# Patient Record
Sex: Male | Born: 1945
Health system: Southern US, Community
[De-identification: ages and names within clinical notes are randomized; demographics above are authoritative.]

## PROBLEM LIST (undated history)

## (undated) DIAGNOSIS — I10 Essential (primary) hypertension: Secondary | ICD-10-CM

## (undated) DIAGNOSIS — I739 Peripheral vascular disease, unspecified: Secondary | ICD-10-CM

## (undated) DIAGNOSIS — G4733 Obstructive sleep apnea (adult) (pediatric): Secondary | ICD-10-CM

## (undated) DIAGNOSIS — Z9989 Dependence on other enabling machines and devices: Secondary | ICD-10-CM

## (undated) DIAGNOSIS — T884XXA Failed or difficult intubation, initial encounter: Secondary | ICD-10-CM

## (undated) DIAGNOSIS — M109 Gout, unspecified: Secondary | ICD-10-CM

## (undated) DIAGNOSIS — E785 Hyperlipidemia, unspecified: Secondary | ICD-10-CM

## (undated) DIAGNOSIS — I2510 Atherosclerotic heart disease of native coronary artery without angina pectoris: Secondary | ICD-10-CM

## (undated) HISTORY — PX: CHOLECYSTECTOMY: SHX55

## (undated) HISTORY — DX: Peripheral vascular disease, unspecified: I73.9

## (undated) HISTORY — DX: Essential (primary) hypertension: I10

## (undated) HISTORY — PX: KNEE ARTHROSCOPY W/ MENISCAL REPAIR: SHX1877

## (undated) HISTORY — PX: OTHER SURGICAL HISTORY: SHX169

## (undated) HISTORY — PX: CATARACT EXTRACTION: SUR2

## (undated) HISTORY — DX: Hyperlipidemia, unspecified: E78.5

## (undated) HISTORY — PX: TONSILLECTOMY: SUR1361

## (undated) HISTORY — DX: Atherosclerotic heart disease of native coronary artery without angina pectoris: I25.10

---

## 2000-05-16 HISTORY — PX: CARDIAC CATHETERIZATION: SHX172

## 2011-09-05 DIAGNOSIS — L259 Unspecified contact dermatitis, unspecified cause: Secondary | ICD-10-CM | POA: Diagnosis not present

## 2011-09-05 DIAGNOSIS — L981 Factitial dermatitis: Secondary | ICD-10-CM | POA: Diagnosis not present

## 2011-09-18 DIAGNOSIS — G4733 Obstructive sleep apnea (adult) (pediatric): Secondary | ICD-10-CM

## 2011-09-18 HISTORY — DX: Obstructive sleep apnea (adult) (pediatric): G47.33

## 2011-09-19 DIAGNOSIS — E785 Hyperlipidemia, unspecified: Secondary | ICD-10-CM | POA: Diagnosis not present

## 2011-09-19 DIAGNOSIS — E7800 Pure hypercholesterolemia, unspecified: Secondary | ICD-10-CM | POA: Diagnosis not present

## 2011-09-19 DIAGNOSIS — I2510 Atherosclerotic heart disease of native coronary artery without angina pectoris: Secondary | ICD-10-CM | POA: Diagnosis not present

## 2011-09-19 DIAGNOSIS — I10 Essential (primary) hypertension: Secondary | ICD-10-CM | POA: Diagnosis not present

## 2011-09-19 DIAGNOSIS — G4733 Obstructive sleep apnea (adult) (pediatric): Secondary | ICD-10-CM | POA: Diagnosis not present

## 2011-09-19 DIAGNOSIS — R0602 Shortness of breath: Secondary | ICD-10-CM | POA: Diagnosis not present

## 2011-09-19 DIAGNOSIS — Z125 Encounter for screening for malignant neoplasm of prostate: Secondary | ICD-10-CM | POA: Diagnosis not present

## 2011-09-19 DIAGNOSIS — R7309 Other abnormal glucose: Secondary | ICD-10-CM | POA: Diagnosis not present

## 2011-10-11 DIAGNOSIS — L98499 Non-pressure chronic ulcer of skin of other sites with unspecified severity: Secondary | ICD-10-CM | POA: Diagnosis not present

## 2012-01-31 DIAGNOSIS — I10 Essential (primary) hypertension: Secondary | ICD-10-CM | POA: Diagnosis not present

## 2012-01-31 DIAGNOSIS — E299 Testicular dysfunction, unspecified: Secondary | ICD-10-CM | POA: Diagnosis not present

## 2012-01-31 DIAGNOSIS — E785 Hyperlipidemia, unspecified: Secondary | ICD-10-CM | POA: Diagnosis not present

## 2012-01-31 DIAGNOSIS — Z125 Encounter for screening for malignant neoplasm of prostate: Secondary | ICD-10-CM | POA: Diagnosis not present

## 2012-02-02 DIAGNOSIS — Z1212 Encounter for screening for malignant neoplasm of rectum: Secondary | ICD-10-CM | POA: Diagnosis not present

## 2012-02-02 DIAGNOSIS — I10 Essential (primary) hypertension: Secondary | ICD-10-CM | POA: Diagnosis not present

## 2012-02-02 DIAGNOSIS — E299 Testicular dysfunction, unspecified: Secondary | ICD-10-CM | POA: Diagnosis not present

## 2012-02-02 DIAGNOSIS — E785 Hyperlipidemia, unspecified: Secondary | ICD-10-CM | POA: Diagnosis not present

## 2012-04-09 DIAGNOSIS — D751 Secondary polycythemia: Secondary | ICD-10-CM | POA: Diagnosis not present

## 2012-04-09 DIAGNOSIS — E669 Obesity, unspecified: Secondary | ICD-10-CM | POA: Diagnosis not present

## 2012-04-09 DIAGNOSIS — R7309 Other abnormal glucose: Secondary | ICD-10-CM | POA: Diagnosis not present

## 2012-04-09 DIAGNOSIS — R58 Hemorrhage, not elsewhere classified: Secondary | ICD-10-CM | POA: Diagnosis not present

## 2012-04-09 DIAGNOSIS — E7800 Pure hypercholesterolemia, unspecified: Secondary | ICD-10-CM | POA: Diagnosis not present

## 2012-04-09 DIAGNOSIS — L408 Other psoriasis: Secondary | ICD-10-CM | POA: Diagnosis not present

## 2012-04-09 DIAGNOSIS — I2510 Atherosclerotic heart disease of native coronary artery without angina pectoris: Secondary | ICD-10-CM | POA: Diagnosis not present

## 2012-04-09 DIAGNOSIS — I10 Essential (primary) hypertension: Secondary | ICD-10-CM | POA: Diagnosis not present

## 2012-05-28 DIAGNOSIS — I10 Essential (primary) hypertension: Secondary | ICD-10-CM | POA: Diagnosis not present

## 2012-05-28 DIAGNOSIS — R0602 Shortness of breath: Secondary | ICD-10-CM | POA: Diagnosis not present

## 2012-05-28 DIAGNOSIS — E669 Obesity, unspecified: Secondary | ICD-10-CM | POA: Diagnosis not present

## 2012-05-28 DIAGNOSIS — E7800 Pure hypercholesterolemia, unspecified: Secondary | ICD-10-CM | POA: Diagnosis not present

## 2012-05-28 DIAGNOSIS — G4733 Obstructive sleep apnea (adult) (pediatric): Secondary | ICD-10-CM | POA: Diagnosis not present

## 2012-05-28 DIAGNOSIS — R7309 Other abnormal glucose: Secondary | ICD-10-CM | POA: Diagnosis not present

## 2012-05-28 DIAGNOSIS — I2510 Atherosclerotic heart disease of native coronary artery without angina pectoris: Secondary | ICD-10-CM | POA: Diagnosis not present

## 2012-07-18 DIAGNOSIS — L259 Unspecified contact dermatitis, unspecified cause: Secondary | ICD-10-CM | POA: Diagnosis not present

## 2012-07-18 DIAGNOSIS — L570 Actinic keratosis: Secondary | ICD-10-CM | POA: Diagnosis not present

## 2012-07-18 DIAGNOSIS — D485 Neoplasm of uncertain behavior of skin: Secondary | ICD-10-CM | POA: Diagnosis not present

## 2012-07-18 DIAGNOSIS — L280 Lichen simplex chronicus: Secondary | ICD-10-CM | POA: Diagnosis not present

## 2012-07-18 DIAGNOSIS — L299 Pruritus, unspecified: Secondary | ICD-10-CM | POA: Diagnosis not present

## 2012-08-07 DIAGNOSIS — J449 Chronic obstructive pulmonary disease, unspecified: Secondary | ICD-10-CM | POA: Diagnosis not present

## 2012-08-07 DIAGNOSIS — D751 Secondary polycythemia: Secondary | ICD-10-CM | POA: Diagnosis not present

## 2012-08-07 DIAGNOSIS — I10 Essential (primary) hypertension: Secondary | ICD-10-CM | POA: Diagnosis not present

## 2012-08-07 DIAGNOSIS — D696 Thrombocytopenia, unspecified: Secondary | ICD-10-CM | POA: Diagnosis not present

## 2012-08-07 DIAGNOSIS — I2510 Atherosclerotic heart disease of native coronary artery without angina pectoris: Secondary | ICD-10-CM | POA: Diagnosis not present

## 2012-08-07 DIAGNOSIS — G4730 Sleep apnea, unspecified: Secondary | ICD-10-CM | POA: Diagnosis not present

## 2012-08-07 DIAGNOSIS — E785 Hyperlipidemia, unspecified: Secondary | ICD-10-CM | POA: Diagnosis not present

## 2012-08-07 DIAGNOSIS — Z79899 Other long term (current) drug therapy: Secondary | ICD-10-CM | POA: Diagnosis not present

## 2012-08-24 DIAGNOSIS — M25559 Pain in unspecified hip: Secondary | ICD-10-CM | POA: Diagnosis not present

## 2012-08-24 DIAGNOSIS — E785 Hyperlipidemia, unspecified: Secondary | ICD-10-CM | POA: Diagnosis not present

## 2012-08-24 DIAGNOSIS — M79609 Pain in unspecified limb: Secondary | ICD-10-CM | POA: Diagnosis not present

## 2012-08-24 DIAGNOSIS — I10 Essential (primary) hypertension: Secondary | ICD-10-CM | POA: Diagnosis not present

## 2012-08-29 DIAGNOSIS — IMO0002 Reserved for concepts with insufficient information to code with codable children: Secondary | ICD-10-CM | POA: Diagnosis not present

## 2012-08-30 DIAGNOSIS — M25559 Pain in unspecified hip: Secondary | ICD-10-CM | POA: Diagnosis not present

## 2012-09-14 DIAGNOSIS — I70209 Unspecified atherosclerosis of native arteries of extremities, unspecified extremity: Secondary | ICD-10-CM | POA: Diagnosis not present

## 2012-09-14 DIAGNOSIS — I771 Stricture of artery: Secondary | ICD-10-CM | POA: Diagnosis not present

## 2012-09-14 DIAGNOSIS — M79609 Pain in unspecified limb: Secondary | ICD-10-CM | POA: Diagnosis not present

## 2012-10-04 DIAGNOSIS — H2510 Age-related nuclear cataract, unspecified eye: Secondary | ICD-10-CM | POA: Diagnosis not present

## 2012-10-18 ENCOUNTER — Encounter: Payer: Self-pay | Admitting: Cardiovascular Disease

## 2012-10-18 ENCOUNTER — Ambulatory Visit (INDEPENDENT_AMBULATORY_CARE_PROVIDER_SITE_OTHER): Payer: Medicare Other | Admitting: Cardiovascular Disease

## 2012-10-18 VITALS — BP 112/82 | HR 72 | Ht 69.0 in | Wt 253.0 lb

## 2012-10-18 DIAGNOSIS — I779 Disorder of arteries and arterioles, unspecified: Secondary | ICD-10-CM | POA: Insufficient documentation

## 2012-10-18 DIAGNOSIS — D689 Coagulation defect, unspecified: Secondary | ICD-10-CM | POA: Diagnosis not present

## 2012-10-18 DIAGNOSIS — Z79899 Other long term (current) drug therapy: Secondary | ICD-10-CM | POA: Diagnosis not present

## 2012-10-18 DIAGNOSIS — I739 Peripheral vascular disease, unspecified: Secondary | ICD-10-CM | POA: Diagnosis not present

## 2012-10-18 DIAGNOSIS — R5383 Other fatigue: Secondary | ICD-10-CM | POA: Diagnosis not present

## 2012-10-18 DIAGNOSIS — I10 Essential (primary) hypertension: Secondary | ICD-10-CM

## 2012-10-18 DIAGNOSIS — I2510 Atherosclerotic heart disease of native coronary artery without angina pectoris: Secondary | ICD-10-CM | POA: Diagnosis not present

## 2012-10-18 DIAGNOSIS — Z01818 Encounter for other preprocedural examination: Secondary | ICD-10-CM

## 2012-10-18 DIAGNOSIS — I25119 Atherosclerotic heart disease of native coronary artery with unspecified angina pectoris: Secondary | ICD-10-CM | POA: Insufficient documentation

## 2012-10-18 DIAGNOSIS — R5381 Other malaise: Secondary | ICD-10-CM

## 2012-10-18 DIAGNOSIS — E785 Hyperlipidemia, unspecified: Secondary | ICD-10-CM | POA: Insufficient documentation

## 2012-10-18 HISTORY — DX: Atherosclerotic heart disease of native coronary artery with unspecified angina pectoris: I25.119

## 2012-10-18 HISTORY — DX: Peripheral vascular disease, unspecified: I73.9

## 2012-10-18 HISTORY — DX: Disorder of arteries and arterioles, unspecified: I77.9

## 2012-10-18 NOTE — Patient Instructions (Addendum)
A myoview stress test and lower extremity doppler has been ordered and will be done prior to the lower extremity angiogram.     Dr. Allyson Sabal has ordered a lower extremity peripheral angiogram to be done at [facility].  This procedure is going to look at the bloodflow in your lower extremities.  If Dr. Allyson Sabal is able to open up the arteries, you will have to spend one night in the hospital.  If he is not able to open the arteries, you will be able to go home that same day.    After the procedure, you will not be allowed to drive for 3 days or push, pull, or lift anything greater than 10 lbs for one week.    You will be required to have bloodwork prior to your procedure.  Our scheduler will advise you on when this need to be done.    REPS: Minerva Areola and Delice Bison

## 2012-10-18 NOTE — Assessment & Plan Note (Signed)
Under good control on current medications 

## 2012-10-18 NOTE — Assessment & Plan Note (Signed)
Patient complains of left calf claudication which is lifestyle limiting. He had a CT and 2 g performed 09/14/12 which showed occlusion of the distal left SFA which was calcific with reconstitution above the knee in the popliteal. I'm going to get flossing up of his longevity is to arrange for him to undergo abdominal aortography with bifemoral runoff at potential endovascular therapy of his SFA disease for left limiting claudication.

## 2012-10-18 NOTE — Progress Notes (Signed)
10/18/2012 Daniel Adkins   1945/09/11  [id number]  Primary Physician EYK, Daniel Santee, MD Primary Cardiologist: Daniel Gess MD Daniel Adkins   HPI:  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 His risk factors include 50-pack-year history of tobacco abuse having quit 35 years ago, hyperlipidemia and hypertension treated medically. His father died at age 33 of a myocardial infarction. He has never had a heart attack or stroke. He does have known coronary artery disease by cath back in 2001 which has remained fairly stable. This is followed by Daniel Adkins at [facility]. He says that he gets left hip as well as calf claudication with ambulation which is lifestyle limiting. He had a CT and exam performed and aspirin that showed occlusion of the distal left SFA which was calcific and reconstituted above the knee in the popliteal artery.   Current Outpatient Prescriptions  Medication Sig Dispense Refill  . aspirin EC 81 MG tablet Take 81 mg by mouth daily.      Marland Kitchen lisinopril (PRINIVIL,ZESTRIL) 20 MG tablet Take 20 mg by mouth daily.      . pravastatin (PRAVACHOL) 20 MG tablet Take 20 mg by mouth daily.       No current facility-administered medications for this visit.    Allergies  Allergen Reactions  . Cephalosporins Anaphylaxis    History   Social History  . Marital Status: Single    Spouse Name: N/A    Number of Children: N/A  . Years of Education: N/A   Occupational History  . Not on file.   Social History Main Topics  . Smoking status: Former Smoker    Quit date: 10/19/1995  . Smokeless tobacco: Never Used  . Alcohol Use: Yes     Comment: couple drinks a month  . Drug Use: No  . Sexually Active: Not  on file   Other Topics Concern  . Not on file   Social History Narrative  . No narrative on file     Review of Systems: General: negative for chills, fever, night sweats or weight changes.  Cardiovascular: negative for chest pain, dyspnea on exertion, edema, orthopnea, palpitations, paroxysmal nocturnal dyspnea or shortness of breath Dermatological: negative for rash Respiratory: negative for cough or wheezing Urologic: negative for hematuria Abdominal: negative for nausea, vomiting, diarrhea, bright red blood per rectum, melena, or hematemesis Neurologic: negative for visual changes, syncope, or dizziness All other systems reviewed and are otherwise negative except as noted above.    Blood pressure 112/82, pulse 72, height 5\' 9"  (1.753 m), weight 253 lb (114.76 kg).  General appearance: alert and no distress Neck: no adenopathy, no carotid bruit, no JVD, supple, symmetrical, trachea midline and thyroid not enlarged, symmetric, no tenderness/mass/nodules Lungs: clear to auscultation bilaterally Heart: regular rate and rhythm, S1, S2 normal, no murmur, click, rub or gallop Abdomen: soft, non-tender; bowel sounds normal; no masses,  no organomegaly Extremities: extremities normal, atraumatic, no cyanosis or edema Pulses: 2+ and symmetric 2+ right, 1+ left pedal pulses.  EKG not performed today  ASSESSMENT AND PLAN:   Peripheral vascular disease with claudication Patient complains of left calf claudication which is lifestyle limiting. He had a CT and 2 g performed 09/14/12 which showed occlusion of the distal left SFA which was calcific with  reconstitution above the knee in the popliteal. I'm going to get flossing up of his longevity is to arrange for him to undergo abdominal aortography with bifemoral runoff at potential endovascular therapy of his SFA disease for left limiting claudication.  Coronary artery disease By cardiac catheterization performed in 2001 both in [facility]  and a tic. His cardiologist at [facility] is Dr. Ave Adkins. He denies chest pain or shortness of breath. I am going to get a Lexus scan Myoview stress test to him for his upcoming coming peripheral vascular procedure.  Hypertension Under good control on current medications      Daniel Gess MD [facility] 10/18/2012 10:39 AM

## 2012-10-18 NOTE — Assessment & Plan Note (Signed)
By cardiac catheterization performed in 2001 both in [facility] and a tic. His cardiologist at [facility] is Dr. Ave Filter. He denies chest pain or shortness of breath. I am going to get a Lexus scan Myoview stress test to him for his upcoming coming peripheral vascular procedure.

## 2012-10-25 ENCOUNTER — Encounter (HOSPITAL_COMMUNITY): Payer: Self-pay | Admitting: Pharmacy Technician

## 2012-10-26 ENCOUNTER — Inpatient Hospital Stay (HOSPITAL_COMMUNITY): Admission: RE | Admit: 2012-10-26 | Payer: Medicare Other | Source: Ambulatory Visit

## 2012-10-26 ENCOUNTER — Ambulatory Visit (HOSPITAL_COMMUNITY)
Admission: RE | Admit: 2012-10-26 | Discharge: 2012-10-26 | Disposition: A | Discharge status: Home / Self Care | Payer: Medicare Other | Source: Ambulatory Visit | Attending: Cardiovascular Disease | Admitting: Cardiovascular Disease

## 2012-10-26 DIAGNOSIS — Z9861 Coronary angioplasty status: Secondary | ICD-10-CM | POA: Insufficient documentation

## 2012-10-26 DIAGNOSIS — I2510 Atherosclerotic heart disease of native coronary artery without angina pectoris: Secondary | ICD-10-CM | POA: Diagnosis not present

## 2012-10-26 DIAGNOSIS — Z01810 Encounter for preprocedural cardiovascular examination: Secondary | ICD-10-CM

## 2012-10-26 DIAGNOSIS — D689 Coagulation defect, unspecified: Secondary | ICD-10-CM | POA: Diagnosis not present

## 2012-10-26 DIAGNOSIS — J438 Other emphysema: Secondary | ICD-10-CM | POA: Insufficient documentation

## 2012-10-26 DIAGNOSIS — R5381 Other malaise: Secondary | ICD-10-CM | POA: Diagnosis not present

## 2012-10-26 DIAGNOSIS — Z79899 Other long term (current) drug therapy: Secondary | ICD-10-CM | POA: Diagnosis not present

## 2012-10-26 LAB — CBC
HCT: 47.5 % (ref 39.0–52.0)
Hemoglobin: 16.4 g/dL (ref 13.0–17.0)
MCH: 32.4 pg (ref 26.0–34.0)
MCHC: 34.5 g/dL (ref 30.0–36.0)
MCV: 93.9 fL (ref 78.0–100.0)

## 2012-10-26 LAB — BASIC METABOLIC PANEL
CO2: 25 mEq/L (ref 19–32)
Chloride: 105 mEq/L (ref 96–112)
Creat: 0.83 mg/dL (ref 0.50–1.35)
Potassium: 4.1 mEq/L (ref 3.5–5.3)

## 2012-10-26 LAB — APTT: aPTT: 37 seconds (ref 24–37)

## 2012-10-26 MED ORDER — TECHNETIUM TC 99M SESTAMIBI GENERIC - CARDIOLITE
10.8000 | Freq: Once | INTRAVENOUS | Status: AC | PRN
  Administered 2012-10-26: 11 via INTRAVENOUS

## 2012-10-26 MED ORDER — TECHNETIUM TC 99M SESTAMIBI GENERIC - CARDIOLITE
31.8000 | Freq: Once | INTRAVENOUS | Status: AC | PRN
  Administered 2012-10-26: 31.8 via INTRAVENOUS

## 2012-10-26 MED ORDER — REGADENOSON 0.4 MG/5ML IV SOLN
0.4000 mg | Freq: Once | INTRAVENOUS | Status: AC
  Administered 2012-10-26: 0.4 mg via INTRAVENOUS

## 2012-10-26 NOTE — Procedures (Addendum)
[place] [facility] [address] [place] [address] [phone]  Cardiology Nuclear Med Study  Dene Nazir is a 67 y.o. male     MRN : [id number]     DOB: December 24, 1945  Procedure Date: 10/26/2012  Nuclear Med Background Indication for Stress Test:  Surgical Clearance History:  Emphysema and CAD;PTCA--2001 Cardiac Risk Factors: Claudication, Family History - CAD, History of Smoking, Hypertension, Lipids, Obesity and PVD  Symptoms:  PT DENIES SYMPTOMS   Nuclear Pre-Procedure Caffeine/Decaff Intake:  9:00pm NPO After: 7:00am   IV Site: R Forearm  IV 0.9% NS with Angio Cath:  22g  Chest Size (in):  46"  IV Started by: Emmit Pomfret, RN  Height: 5\' 9"  (1.753 m)  Cup Size: n/a  BMI:  Body mass index is 37.34 kg/(m^2). Weight:  253 lb (114.76 kg)   Tech Comments:  N/A    Nuclear Med Study 1 or 2 day study: 1 day  Stress Test Type:  Lexiscan  Order Authorizing Provider:  Nanetta Batty, MD   Resting Radionuclide: Technetium 5m Sestamibi  Resting Radionuclide Dose: 10.8 mCi   Stress Radionuclide:  Technetium 41m Sestamibi  Stress Radionuclide Dose: 31.8 mCi           Stress Protocol Rest HR: 55 Stress HR:81  Rest BP: 102/68 Stress BP: 121/56  Exercise Time (min): n/a METS: n/a          Dose of Adenosine (mg):  n/a Dose of Lexiscan: 0.4 mg  Dose of Atropine (mg): n/a Dose of Dobutamine: n/a mcg/kg/min (at max HR)  Stress Test Technologist: Ernestene Mention, CCT Nuclear Technologist: Gonzella Lex, CNMT   Rest Procedure:  Myocardial perfusion imaging was performed at rest 45 minutes following the intravenous administration of Technetium 55m Sestamibi. Stress Procedure:  The patient received IV Lexiscan 0.4 mg over 15-seconds.  Technetium 14m Sestamibi injected at 30-seconds.  There were no significant changes with Lexiscan.  Quantitative spect images were obtained after a 45 minute delay.  Transient Ischemic Dilatation  (Normal <1.22):  1.17 Lung/Heart Ratio (Normal <0.45):  0.34 QGS EDV:  86 ml QGS ESV:  36 ml LV Ejection Fraction: 58%      Rest ECG: NSR - Normal EKG  Stress ECG: No significant change from baseline ECG  QPS Raw Data Images:  Normal; no motion artifact; normal heart/lung ratio. Stress Images:  Normal homogeneous uptake in all areas of the myocardium. Rest Images:  Normal homogeneous uptake in all areas of the myocardium. Subtraction (SDS):  No evidence of ischemia.  Impression Exercise Capacity:  Lexiscan with no exercise. BP Response:  HTN at baseline Clinical Symptoms:  No significant symptoms noted. ECG Impression:  No significant ST segment change suggestive of ischemia. Comparison with Prior Nuclear Study: No significant change from previous study  Overall Impression:  Normal stress nuclear study.  LV Wall Motion:  NL LV Function; NL Wall Motion   Eual Lindstrom, MD  10/26/2012 1:08 PM

## 2012-10-29 ENCOUNTER — Ambulatory Visit (HOSPITAL_COMMUNITY)
Admission: RE | Admit: 2012-10-29 | Discharge: 2012-10-29 | Disposition: A | Discharge status: Home / Self Care | Payer: Medicare Other | Source: Ambulatory Visit | Attending: Cardiovascular Disease | Admitting: Cardiovascular Disease

## 2012-10-29 DIAGNOSIS — I739 Peripheral vascular disease, unspecified: Secondary | ICD-10-CM | POA: Insufficient documentation

## 2012-10-29 DIAGNOSIS — Z01810 Encounter for preprocedural cardiovascular examination: Secondary | ICD-10-CM

## 2012-10-29 NOTE — Progress Notes (Signed)
Arterial Duplex Completed. Brianna L Mazza  

## 2012-10-30 ENCOUNTER — Ambulatory Visit (HOSPITAL_COMMUNITY)
Admission: RE | Admit: 2012-10-30 | Discharge: 2012-10-31 | Disposition: A | Discharge status: Home / Self Care | Payer: Medicare Other | Source: Ambulatory Visit | Attending: Cardiovascular Disease | Admitting: Cardiovascular Disease

## 2012-10-30 ENCOUNTER — Encounter (HOSPITAL_COMMUNITY): Payer: Self-pay | Admitting: General Practice

## 2012-10-30 ENCOUNTER — Encounter (HOSPITAL_COMMUNITY)
Admission: RE | Disposition: A | Discharge status: Home / Self Care | Payer: Self-pay | Source: Ambulatory Visit | Attending: Cardiovascular Disease

## 2012-10-30 DIAGNOSIS — E785 Hyperlipidemia, unspecified: Secondary | ICD-10-CM | POA: Insufficient documentation

## 2012-10-30 DIAGNOSIS — I70209 Unspecified atherosclerosis of native arteries of extremities, unspecified extremity: Secondary | ICD-10-CM | POA: Diagnosis not present

## 2012-10-30 DIAGNOSIS — E663 Overweight: Secondary | ICD-10-CM | POA: Insufficient documentation

## 2012-10-30 DIAGNOSIS — I10 Essential (primary) hypertension: Secondary | ICD-10-CM

## 2012-10-30 DIAGNOSIS — I70219 Atherosclerosis of native arteries of extremities with intermittent claudication, unspecified extremity: Secondary | ICD-10-CM | POA: Insufficient documentation

## 2012-10-30 DIAGNOSIS — Z87891 Personal history of nicotine dependence: Secondary | ICD-10-CM | POA: Insufficient documentation

## 2012-10-30 DIAGNOSIS — I714 Abdominal aortic aneurysm, without rupture, unspecified: Secondary | ICD-10-CM | POA: Insufficient documentation

## 2012-10-30 DIAGNOSIS — I739 Peripheral vascular disease, unspecified: Secondary | ICD-10-CM | POA: Diagnosis present

## 2012-10-30 DIAGNOSIS — Z01818 Encounter for other preprocedural examination: Secondary | ICD-10-CM

## 2012-10-30 HISTORY — PX: ATHERECTOMY: SHX5502

## 2012-10-30 HISTORY — DX: Obstructive sleep apnea (adult) (pediatric): G47.33

## 2012-10-30 HISTORY — DX: Dependence on other enabling machines and devices: Z99.89

## 2012-10-30 HISTORY — DX: Failed or difficult intubation, initial encounter: T88.4XXA

## 2012-10-30 HISTORY — PX: CORONARY ANGIOPLASTY WITH STENT PLACEMENT: SHX49

## 2012-10-30 HISTORY — DX: Gout, unspecified: M10.9

## 2012-10-30 LAB — POCT ACTIVATED CLOTTING TIME
Activated Clotting Time: 241 seconds
Activated Clotting Time: 241 seconds
Activated Clotting Time: 181 seconds

## 2012-10-30 SURGERY — ATHERECTOMY
Anesthesia: LOCAL

## 2012-10-30 MED ORDER — ASPIRIN EC 81 MG PO TBEC
81.0000 mg | DELAYED_RELEASE_TABLET | Freq: Every day | ORAL | Status: DC
  Administered 2012-10-31: 10:00:00 81 mg via ORAL
  Filled 2012-10-30 (×2): qty 1

## 2012-10-30 MED ORDER — SODIUM CHLORIDE 0.9 % IV SOLN
INTRAVENOUS | Status: DC
  Administered 2012-10-30: 1000 mL via INTRAVENOUS

## 2012-10-30 MED ORDER — DIAZEPAM 5 MG PO TABS
ORAL_TABLET | ORAL | Status: AC
  Administered 2012-10-30: 5 mg via ORAL
  Filled 2012-10-30: qty 1

## 2012-10-30 MED ORDER — ASPIRIN 81 MG PO CHEW
CHEWABLE_TABLET | ORAL | Status: AC
  Administered 2012-10-30: 324 mg via ORAL
  Filled 2012-10-30: qty 4

## 2012-10-30 MED ORDER — ASPIRIN 81 MG PO CHEW: 324.0000 mg | CHEWABLE_TABLET | ORAL | Status: AC

## 2012-10-30 MED ORDER — LISINOPRIL 20 MG PO TABS
20.0000 mg | ORAL_TABLET | Freq: Every day | ORAL | Status: DC
  Administered 2012-10-31: 20 mg via ORAL
  Filled 2012-10-30 (×2): qty 1

## 2012-10-30 MED ORDER — HEPARIN (PORCINE) IN NACL 2-0.9 UNIT/ML-% IJ SOLN
INTRAMUSCULAR | Status: AC
  Filled 2012-10-30: qty 1000

## 2012-10-30 MED ORDER — CLOPIDOGREL BISULFATE 300 MG PO TABS
ORAL_TABLET | ORAL | Status: AC
  Filled 2012-10-30: qty 1

## 2012-10-30 MED ORDER — HEPARIN SODIUM (PORCINE) 1000 UNIT/ML IJ SOLN
INTRAMUSCULAR | Status: AC
  Filled 2012-10-30: qty 1

## 2012-10-30 MED ORDER — CLOPIDOGREL BISULFATE 75 MG PO TABS
75.0000 mg | ORAL_TABLET | Freq: Every day | ORAL | Status: DC
  Administered 2012-10-31: 75 mg via ORAL
  Filled 2012-10-30: qty 1

## 2012-10-30 MED ORDER — LIDOCAINE HCL (PF) 1 % IJ SOLN
INTRAMUSCULAR | Status: AC
  Filled 2012-10-30: qty 30

## 2012-10-30 MED ORDER — ONDANSETRON HCL 4 MG/2ML IJ SOLN
INTRAMUSCULAR | Status: AC
  Filled 2012-10-30: qty 2

## 2012-10-30 MED ORDER — NITROGLYCERIN 0.2 MG/ML ON CALL CATH LAB
INTRAVENOUS | Status: AC
  Filled 2012-10-30: qty 1

## 2012-10-30 MED ORDER — DIAZEPAM 5 MG PO TABS: 5.0000 mg | ORAL_TABLET | ORAL | Status: AC

## 2012-10-30 MED ORDER — SODIUM CHLORIDE 0.9 % IV SOLN: INTRAVENOUS | Status: AC

## 2012-10-30 MED ORDER — ASPIRIN EC 325 MG PO TBEC
325.0000 mg | DELAYED_RELEASE_TABLET | Freq: Every day | ORAL | Status: DC
  Filled 2012-10-30: qty 1

## 2012-10-30 MED ORDER — ONDANSETRON HCL 4 MG/2ML IJ SOLN
4.0000 mg | Freq: Four times a day (QID) | INTRAMUSCULAR | Status: DC | PRN
  Administered 2012-10-30: 4 mg via INTRAVENOUS

## 2012-10-30 MED ORDER — FAMOTIDINE IN NACL 20-0.9 MG/50ML-% IV SOLN
20.0000 mg | Freq: Two times a day (BID) | INTRAVENOUS | Status: DC
  Administered 2012-10-30 – 2012-10-31 (×2): 20 mg via INTRAVENOUS
  Filled 2012-10-30 (×2): qty 50

## 2012-10-30 MED ORDER — VERAPAMIL HCL 2.5 MG/ML IV SOLN
INTRAVENOUS | Status: AC
  Filled 2012-10-30: qty 2

## 2012-10-30 MED ORDER — ALUM & MAG HYDROXIDE-SIMETH 200-200-20 MG/5ML PO SUSP
30.0000 mL | ORAL | Status: DC | PRN
  Administered 2012-10-30: 20:00:00 30 mL via ORAL
  Filled 2012-10-30: qty 30

## 2012-10-30 MED ORDER — FAMOTIDINE IN NACL 20-0.9 MG/50ML-% IV SOLN
INTRAVENOUS | Status: AC
  Filled 2012-10-30: qty 50

## 2012-10-30 MED ORDER — ACETAMINOPHEN 325 MG PO TABS: 650.0000 mg | ORAL_TABLET | ORAL | Status: DC | PRN

## 2012-10-30 MED ORDER — SODIUM CHLORIDE 0.9 % IJ SOLN: 3.0000 mL | INTRAMUSCULAR | Status: DC | PRN

## 2012-10-30 NOTE — H&P (Signed)
    Pt was reexamined and existing H & P reviewed. No changes found.  Runell Gess, MD [facility] 10/30/2012 1:15 PM

## 2012-10-30 NOTE — CV Procedure (Addendum)
Daniel Adkins is a 67 y.o. male    [id number] LOCATION:  FACILITY: [facility]  PHYSICIAN: Nanetta Batty, M.D. 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 His risk factors include 50-pack-year history of tobacco abuse having quit 35 years ago, hyperlipidemia and hypertension treated medically. His father died at age 73 of a myocardial infarction. He has never had a heart attack or stroke. He does have known coronary artery disease by cath back in 2001 which has remained fairly stable. This is followed by Daniel Adkins  at [facility]. He says that he gets left hip as well as calf claudication with ambulation which is lifestyle limiting. He had a CT angiogram  performed which showed occlusion of the distal left SFA which was calcific and reconstituted above the knee in the popliteal artery. A Doppler study in our office confirm this. The patient presents now for angiography and potential percutaneous intervention for lifestyle limiting claudication    PROCEDURE DESCRIPTION:    The patient was brought to the second floor [place] [facility] cath lab in the postabsorptive state. He was premedicated with Valium 5 mg by mouth. His right groinwas prepped and shaved in usual sterile fashion. Xylocaine 1% was used  for local anesthesia. A 5 French sheath was inserted into the right common femoral artery using standard Seldinger technique. A 5 French pigtail catheter was used for abdominal aortography. Catheter was then pulled down to the iliac bifurcation and bilateral iliac angiography and bifemoral runoff was then performed using  bolus chase digital subtraction step staple technique.Visipaque dye was used for the entirety of the case. Retrograde aortic pressure was monitored during the case.   HEMODYNAMICS:    AO SYSTOLIC/AO DIASTOLIC: 129/69    ANGIOGRAPHIC RESULTS:   1. Abdominal aortogram-renal artery is widely patent. The infrarenal abdominal aorta at its very small abdominal aortic aneurysm noted.  2. Left lower extremity-the SFA in its mid and distal portion was fluoroscopically calcified. There was a 95-90% segmental/subtotal distal left SFA stenosis with 2 vessel runoff. The anterior tibial was occluded.  3. Right lower extremity-who is a 40-50% calcified smooth distal right SFA stenosis with three-vessel runoff. The anterior tibial was occluded.  IMPRESSION:Daniel Adkins has a subtotally occluded calcified segmental distal left SFA stenosis best treated with diamondback orbital rotational atherectomy, plus or minus PTA, plus or minus stenting.  Procedure description: Contralateral access was obtained with a 5 [place] crossover catheter, 0.35 angled Glidewire, 7 French/55 cm long Ansel sheath, and a Rosen wire. Visipaque was used for the entirety of the case. A total of 141 cc was administered to the patient. A total bypass units was given with an ACT of 241. The lesion was crossed with a 0.014/300 cm length Regalia wire. This was placed through a 18 quick cross endhole  Catheter. I then exchanged the Rigali wire for a 014 viper wire. I deployed a large NAV 6 distal protection device in the below-the-knee popliteal artery.Following this orbital rotational atherectomy was performed with a 1.5 mm Stealth Burr up to 120,000 rpm's. This was then upgraded to a 2.5 mm predator burr up to 120,000 rpm's. Angiography was performed after each atherectomy  runs. The distal protection device was then captured and withdrawn from the body, and the guidewire was then used to recross the lesion with the chocolate balloon.Following  this angioplasty was performed with a 6 mm x 40 mm long chocolate balloon followed by overlapping inflations with a 7 mm x 40 mm long balloon to prepare the vessel for IDEV stenting. A 6.5 mm/100 mm long IDEV  stent was then carefully deployed under fluoroscopic control with an excellent result. Completion angiography confirmed a wide open distal left SFA with 2 vessel runoff. The posterior tibial is dominant.  Final impression: Successful diamondback orbital rotational atherectomy, PTA using chocolate balloon, and IDEV  the stenting with an excellent angiographic result. The patient received 300 mg of by mouth Plavix. The sheath was then withdrawn across the bifurcation and exchanged over an 035 Versicore  wire for a short 7 [place] sheath. The patient left the lab in stable condition.he'll be hydrated overnight and discharged home in the morning. We'll get followup Dopplers in our office after which she will see me back.   Daniel Gess MD, [facility] 10/30/2012 3:33 PM

## 2012-10-31 ENCOUNTER — Other Ambulatory Visit: Payer: Self-pay | Admitting: Physician Assistant

## 2012-10-31 DIAGNOSIS — I739 Peripheral vascular disease, unspecified: Secondary | ICD-10-CM | POA: Diagnosis not present

## 2012-10-31 LAB — BASIC METABOLIC PANEL
BUN: 13 mg/dL (ref 6–23)
Calcium: 9 mg/dL (ref 8.4–10.5)
GFR calc Af Amer: >90 mL/min (ref >90)
GFR calc non Af Amer: 90 mL/min — ABNORMAL LOW (ref >90)
Potassium: 4 mEq/L (ref 3.5–5.1)

## 2012-10-31 LAB — CBC
HCT: 43 % (ref 39.0–52.0)
MCHC: 35.6 g/dL (ref 30.0–36.0)
RDW: 12.4 % (ref 11.5–15.5)

## 2012-10-31 MED ORDER — CLOPIDOGREL BISULFATE 75 MG PO TABS: 75.0000 mg | ORAL_TABLET | Freq: Every day | ORAL | Status: DC

## 2012-10-31 MED ORDER — FAMOTIDINE 20 MG PO TABS
20.0000 mg | ORAL_TABLET | Freq: Two times a day (BID) | ORAL | Status: DC
  Filled 2012-10-31: qty 1

## 2012-10-31 NOTE — Progress Notes (Signed)
Pt. Seen and examined. Agree with the NP/PA-C note as written.  Doing well s/p diamondback. There is moderate right groin ecchymosis, but no bruit and minimal pain. He reports some left calf tightness, but no pain. Ambulating without difficulty. Ok for discharge home today. Plavix 75 mg daily and ASA 81 mg daily. Ok to ambulate at his job.  Chrystie Nose, MD, [facility] Attending Cardiologist [facility]

## 2012-10-31 NOTE — Progress Notes (Signed)
Pt c/o epigastric / esophageal discomfort & hard to swallow. V/S stable. No SOB. EKG done without changes. Maalox given as PRN order. Dr. Charm Barges informed. Will continue to monitor pt.

## 2012-10-31 NOTE — Progress Notes (Signed)
Subjective: No complaints other than mild groin tenderness.  Objective: Vital signs in last 24 hours: Temp:  [97.4 F (36.3 C)-98 F (36.7 C)] 97.7 F (36.5 C) (06/18 0753) Pulse Rate:  [52-63] 61 (06/18 0753) Resp:  [16-20] 20 (06/18 0753) BP: (110-149)/(52-77) 124/65 mmHg (06/18 0753) SpO2:  [93 %-100 %] 96 % (06/18 0753) Weight:  [245 lb (111.131 kg)-247 lb 2.2 oz (112.1 kg)] 247 lb 2.2 oz (112.1 kg) (06/18 0024) Last BM Date: 10/31/12  Intake/Output from previous day: 06/17 0701 - 06/18 0700 In: 870 [P.O.:120; I.V.:750] Out: 1350 [Urine:1350] Intake/Output this shift: Total I/O In: 360 [P.O.:360] Out: -   Medications Current Facility-Administered Medications  Medication Dose Route Frequency Provider Last Rate Last Dose  . acetaminophen (TYLENOL) tablet 650 mg  650 mg Oral Q4H PRN Runell Gess, MD      . alum & mag hydroxide-simeth (MAALOX/MYLANTA) 200-200-20 MG/5ML suspension 30 mL  30 mL Oral PRN Runell Gess, MD   30 mL at 10/30/12 1941  . aspirin EC tablet 325 mg  325 mg Oral Daily Runell Gess, MD      . aspirin EC tablet 81 mg  81 mg Oral Daily Runell Gess, MD      . clopidogrel (PLAVIX) tablet 75 mg  75 mg Oral Q breakfast Runell Gess, MD   75 mg at 10/31/12 0805  . famotidine (PEPCID) tablet 20 mg  20 mg Oral BID Runell Gess, MD      . lisinopril (PRINIVIL,ZESTRIL) tablet 20 mg  20 mg Oral Daily Runell Gess, MD      . ondansetron [facility]) injection 4 mg  4 mg Intravenous Q6H PRN Runell Gess, MD   4 mg at 10/30/12 1618    PE: General appearance: alert, cooperative and no distress Lungs: clear to auscultation bilaterally Heart: regular rate and rhythm, S1, S2 normal, no murmur, click, rub or gallop Extremities: No LEE Pulses: Radials 2+ and symmetric.  2+ right DP and PT.  1+ left DP.  0+ left PT Skin: moderate area of ecchymosis in the right groin.  Mildly tender.  No hematoma. Neurologic: Grossly normal  Lab Results:    Recent Labs  10/31/12 0610  WBC 7.3  HGB 15.3  HCT 43.0  PLT 121*   BMET  Recent Labs  10/31/12 0610  NA 139  K 4.0  CL 106  CO2 25  GLUCOSE 96  BUN 13  CREATININE 0.81  CALCIUM 9.0   HEMODYNAMICS:  AO SYSTOLIC/AO DIASTOLIC: 129/69  ANGIOGRAPHIC RESULTS:  1. Abdominal aortogram-renal artery is widely patent. The infrarenal abdominal aorta at its very small abdominal aortic aneurysm noted.  2. Left lower extremity-the SFA in its mid and distal portion was fluoroscopically calcified. There was a 95-90% segmental/subtotal distal left SFA stenosis with 2 vessel runoff. The anterior tibial was occluded.  3. Right lower extremity-who is a 40-50% calcified smooth distal right SFA stenosis with three-vessel runoff. The anterior tibial was occluded.  IMPRESSION:Daniel Adkins has a subtotally occluded calcified segmental distal left SFA stenosis best treated with diamondback orbital rotational atherectomy, plus or minus PTA, plus or minus stenting.  Procedure description: Contralateral access was obtained with a 5 [place] crossover catheter, 0.35 angled Glidewire, 7 French/55 cm long Ansel sheath, and a Rosen wire. Visipaque was used for the entirety of the case. A total of 141 cc was administered to the patient. A total bypass units was given with an ACT of 241.  The lesion was crossed with a 0.014/300 cm length Regalia wire. This was placed through a 18 quick cross endhole Catheter. I then exchanged the Rigali wire for a 014 viper wire. I deployed a large NAV 6 distal protection device in the below-the-knee popliteal artery.Following this orbital rotational atherectomy was performed with a 1.5 mm Stealth Burr up to 120,000 rpm's. This was then upgraded to a 2.5 mm predator burr up to 120,000 rpm's. Angiography was performed after each atherectomy runs. The distal protection device was then captured and withdrawn from the body, and the guidewire was then used to recross the lesion with the  chocolate balloon.Following this angioplasty was performed with a 6 mm x 40 mm long chocolate balloon followed by overlapping inflations with a 7 mm x 40 mm long balloon to prepare the vessel for IDEV stenting. A 6.5 mm/100 mm long IDEV stent was then carefully deployed under fluoroscopic control with an excellent result. Completion angiography confirmed a wide open distal left SFA with 2 vessel runoff. The posterior tibial is dominant.   Final impression:  Successful diamondback orbital rotational atherectomy, PTA using chocolate balloon, and IDEV the stenting with an excellent angiographic result. The patient received 300 mg of by mouth Plavix. The sheath was then withdrawn across the bifurcation and exchanged over an 035 Versicore wire for a short 7 [place] sheath. The patient left the lab in stable condition.he'll be hydrated overnight and discharged home in the morning. We'll get followup Dopplers in our office after which she will see me back.  Runell Gess MD, [facility]  10/30/2012  Assessment/Plan  Principal Problem:   Peripheral vascular disease with claudication Active Problems:   Hypertension   Hyperlipidemia  Plan:  SP Successful diamondback orbital rotational atherectomy, PTA using chocolate balloon, and IDEV stenting with an excellent angiographic result.  BP and HR stable.  LEA dopplers in 2-3 weeks and FU with Dr. Allyson Sabal.    LOS: 1 day    Daniel Adkins 10/31/2012 8:43 AM

## 2012-10-31 NOTE — Progress Notes (Signed)
DC instructions & paperwork given to pt, all questions answered, pt denies any pain, NAD, NT wheeled pt to [employer] to meet with ride

## 2012-11-14 DIAGNOSIS — H18419 Arcus senilis, unspecified eye: Secondary | ICD-10-CM | POA: Diagnosis not present

## 2012-11-14 DIAGNOSIS — H2510 Age-related nuclear cataract, unspecified eye: Secondary | ICD-10-CM | POA: Diagnosis not present

## 2012-11-14 DIAGNOSIS — H02839 Dermatochalasis of unspecified eye, unspecified eyelid: Secondary | ICD-10-CM | POA: Diagnosis not present

## 2012-11-14 DIAGNOSIS — H25019 Cortical age-related cataract, unspecified eye: Secondary | ICD-10-CM | POA: Diagnosis not present

## 2012-11-15 ENCOUNTER — Ambulatory Visit (HOSPITAL_COMMUNITY)
Admission: RE | Admit: 2012-11-15 | Discharge: 2012-11-15 | Disposition: A | Discharge status: Home / Self Care | Payer: Medicare Other | Source: Ambulatory Visit | Attending: Cardiovascular Disease | Admitting: Cardiovascular Disease

## 2012-11-15 DIAGNOSIS — I70219 Atherosclerosis of native arteries of extremities with intermittent claudication, unspecified extremity: Secondary | ICD-10-CM | POA: Diagnosis not present

## 2012-11-15 DIAGNOSIS — I739 Peripheral vascular disease, unspecified: Secondary | ICD-10-CM | POA: Diagnosis not present

## 2012-11-15 NOTE — Progress Notes (Signed)
Arterial Duplex Left Lower Ext. Completed. Marilynne Halsted, RDMS, RVT

## 2012-11-22 ENCOUNTER — Encounter: Payer: Self-pay | Admitting: Cardiovascular Disease

## 2012-11-22 ENCOUNTER — Ambulatory Visit (INDEPENDENT_AMBULATORY_CARE_PROVIDER_SITE_OTHER): Payer: Medicare Other | Admitting: Cardiovascular Disease

## 2012-11-22 VITALS — BP 110/80 | HR 62 | Ht 69.0 in | Wt 244.8 lb

## 2012-11-22 DIAGNOSIS — I739 Peripheral vascular disease, unspecified: Secondary | ICD-10-CM | POA: Diagnosis not present

## 2012-11-22 NOTE — Assessment & Plan Note (Signed)
On statin drug followed by his primary care physician

## 2012-11-22 NOTE — Assessment & Plan Note (Signed)
Mr. Daniel Adkins underwent percutaneous revascularization of his left SFA by myself 10/30/12 using diamondback orbital rotational atherectomy, PTA with chocolate balloon and stenting using the IDEV  stent with excellent angiographic and clinical results. His followup Dopplers performed 11/15/12 revealed a widely patent SFA with an ABI on the left of 1.1. He no longer has claudication but does complain of some left hip pain.he is on aspirin and Plavix.

## 2012-11-22 NOTE — Patient Instructions (Signed)
Your physician recommends that you schedule a follow-up appointment in: 1 year  Your physician has requested that you have a lower extremity arterial exercise duplex. During this test, exercise and ultrasound are used to evaluate arterial blood flow in the legs. Allow one hour for this exam. There are no restrictions or special instructions. In 6 months

## 2012-11-22 NOTE — Progress Notes (Signed)
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 His risk factors include 50-pack-year history of tobacco abuse having quit 35 years ago, hyperlipidemia and hypertension treated medically. His father died at age 67 of a myocardial infarction. He has never had a heart attack or stroke. He does have known coronary artery disease by cath back in 2001 which has remained fairly stable. This is followed by Daniel Adkins at [facility]. He says that he gets left hip as well as calf claudication with ambulation which is lifestyle limiting. He had a CT angiogram performed which showed occlusion of the distal left SFA which was calcific and reconstituted above the knee in the popliteal artery. A Doppler study in our office confirm this. The patient underwent angiography and percutaneous intervention for lifestyle limiting claudication of his left SFA using diamondback orbital rotational atherectomy, PTA and stenting using a IDEV stent. He will her complaints of claudication. His lower extremity arterial Dopplers performed in followup on 11/15/12 revealed a left ABI of 1.1 with a widely patent left SFA.   Current Outpatient Prescriptions  Medication Sig Dispense Refill  . aspirin EC 81 MG tablet Take 81 mg by mouth daily.      Marland Kitchen BESIVANCE 0.6 % SUSP Place 1 drop into both eyes.      Marland Kitchen clopidogrel (PLAVIX) 75 MG tablet Take 1 tablet (75 mg total) by mouth daily with breakfast.  30 tablet  11  . DUREZOL 0.05 % EMUL Place 1 drop into the right eye daily.      Marland Kitchen lisinopril (PRINIVIL,ZESTRIL) 20 MG tablet  Take 20 mg by mouth daily.      . pravastatin (PRAVACHOL) 20 MG tablet Take 20 mg by mouth daily.      Marland Kitchen PROLENSA 0.07 % SOLN Place 1 drop into both eyes daily.       No current facility-administered medications for this visit.    Allergies  Allergen Reactions  . Cephalosporins Anaphylaxis    History   Social History  . Marital Status: Single    Spouse Name: N/A    Number of Children: N/A  . Years of Education: N/A   Occupational History  . Not on file.   Social History Main Topics  . Smoking status: Former Smoker -- 37 years    Types: Cigars    Quit date: 10/19/1995  . Smokeless tobacco: Never Used     Comment: 10/30/2012 "last smoked 5 swisher sweets/day when I quit smoking"  . Alcohol Use: Yes     Comment: 10/30/2012 "mixed drink usually once/month; sometimes twice"  . Drug Use: No  . Sexually Active: Yes   Other Topics Concern  . Not on file   Social History Narrative  . No narrative on file     Review of Systems: General: negative for chills, fever, night sweats or weight changes.  Cardiovascular: negative for chest pain, dyspnea on exertion, edema, orthopnea, palpitations, paroxysmal nocturnal dyspnea or shortness of breath Dermatological: negative for rash Respiratory: negative for cough or wheezing Urologic: negative for hematuria Abdominal: negative for nausea, vomiting, diarrhea, bright red blood per rectum, melena, or hematemesis Neurologic: negative  for visual changes, syncope, or dizziness All other systems reviewed and are otherwise negative except as noted above.    Blood pressure 110/80, pulse 62, height 5\' 9"  (1.753 m), weight 244 lb 12.8 oz (111.041 kg).  General appearance: alert and no distress Neck: no adenopathy, no carotid bruit, no JVD, supple, symmetrical, trachea midline and thyroid not enlarged, symmetric, no tenderness/mass/nodules Lungs: clear to auscultation bilaterally Heart: regular rate and rhythm, S1, S2 normal, no murmur,  click, rub or gallop Extremities: extremities normal, atraumatic, no cyanosis or edema and he has a small resolving hematoma in his right groin without bruit Pulses: 2+ and symmetric  EKG not performed today  ASSESSMENT AND PLAN:   Peripheral vascular disease with claudication Daniel Adkins underwent percutaneous revascularization of his left SFA by myself 10/30/12 using diamondback orbital rotational atherectomy, PTA with chocolate balloon and stenting using the IDEV  stent with excellent angiographic and clinical results. His followup Dopplers performed 11/15/12 revealed a widely patent SFA with an ABI on the left of 1.1. He no longer has claudication but does complain of some left hip pain.he is on aspirin and Plavix.  Hyperlipidemia On statin drug followed by his primary care physician      Runell Gess MD [facility], [facility] 11/22/2012 8:44 AM

## 2012-12-04 DIAGNOSIS — H2510 Age-related nuclear cataract, unspecified eye: Secondary | ICD-10-CM | POA: Diagnosis not present

## 2012-12-04 DIAGNOSIS — H269 Unspecified cataract: Secondary | ICD-10-CM | POA: Diagnosis not present

## 2012-12-10 DIAGNOSIS — Z125 Encounter for screening for malignant neoplasm of prostate: Secondary | ICD-10-CM | POA: Diagnosis not present

## 2012-12-19 ENCOUNTER — Other Ambulatory Visit: Payer: Self-pay

## 2012-12-21 DIAGNOSIS — I739 Peripheral vascular disease, unspecified: Secondary | ICD-10-CM | POA: Diagnosis not present

## 2012-12-21 DIAGNOSIS — T148XXA Other injury of unspecified body region, initial encounter: Secondary | ICD-10-CM | POA: Diagnosis not present

## 2012-12-27 DIAGNOSIS — H903 Sensorineural hearing loss, bilateral: Secondary | ICD-10-CM | POA: Diagnosis not present

## 2012-12-27 DIAGNOSIS — H9319 Tinnitus, unspecified ear: Secondary | ICD-10-CM | POA: Diagnosis not present

## 2013-01-10 DIAGNOSIS — I2510 Atherosclerotic heart disease of native coronary artery without angina pectoris: Secondary | ICD-10-CM | POA: Diagnosis not present

## 2013-01-10 DIAGNOSIS — G4733 Obstructive sleep apnea (adult) (pediatric): Secondary | ICD-10-CM | POA: Diagnosis not present

## 2013-01-10 DIAGNOSIS — I10 Essential (primary) hypertension: Secondary | ICD-10-CM | POA: Diagnosis not present

## 2013-01-10 DIAGNOSIS — I739 Peripheral vascular disease, unspecified: Secondary | ICD-10-CM | POA: Diagnosis not present

## 2013-02-01 DIAGNOSIS — D751 Secondary polycythemia: Secondary | ICD-10-CM | POA: Diagnosis not present

## 2013-02-01 DIAGNOSIS — N400 Enlarged prostate without lower urinary tract symptoms: Secondary | ICD-10-CM | POA: Diagnosis not present

## 2013-02-01 DIAGNOSIS — Z1212 Encounter for screening for malignant neoplasm of rectum: Secondary | ICD-10-CM | POA: Diagnosis not present

## 2013-02-01 DIAGNOSIS — I10 Essential (primary) hypertension: Secondary | ICD-10-CM | POA: Diagnosis not present

## 2013-02-01 DIAGNOSIS — R7309 Other abnormal glucose: Secondary | ICD-10-CM | POA: Diagnosis not present

## 2013-02-01 DIAGNOSIS — E785 Hyperlipidemia, unspecified: Secondary | ICD-10-CM | POA: Diagnosis not present

## 2013-02-01 DIAGNOSIS — I739 Peripheral vascular disease, unspecified: Secondary | ICD-10-CM | POA: Diagnosis not present

## 2013-02-01 DIAGNOSIS — E291 Testicular hypofunction: Secondary | ICD-10-CM | POA: Diagnosis not present

## 2013-02-06 ENCOUNTER — Encounter: Payer: Self-pay | Admitting: Cardiovascular Disease

## 2013-02-06 DIAGNOSIS — R079 Chest pain, unspecified: Secondary | ICD-10-CM | POA: Diagnosis not present

## 2013-02-21 DIAGNOSIS — G4710 Hypersomnia, unspecified: Secondary | ICD-10-CM | POA: Diagnosis not present

## 2013-02-25 DIAGNOSIS — I517 Cardiomegaly: Secondary | ICD-10-CM | POA: Diagnosis not present

## 2013-02-25 DIAGNOSIS — E291 Testicular hypofunction: Secondary | ICD-10-CM | POA: Diagnosis not present

## 2013-02-25 DIAGNOSIS — K8020 Calculus of gallbladder without cholecystitis without obstruction: Secondary | ICD-10-CM | POA: Diagnosis not present

## 2013-02-25 DIAGNOSIS — I10 Essential (primary) hypertension: Secondary | ICD-10-CM | POA: Diagnosis not present

## 2013-02-25 DIAGNOSIS — I209 Angina pectoris, unspecified: Secondary | ICD-10-CM | POA: Diagnosis not present

## 2013-02-25 DIAGNOSIS — D751 Secondary polycythemia: Secondary | ICD-10-CM | POA: Diagnosis not present

## 2013-02-25 DIAGNOSIS — D696 Thrombocytopenia, unspecified: Secondary | ICD-10-CM | POA: Diagnosis not present

## 2013-02-25 DIAGNOSIS — I739 Peripheral vascular disease, unspecified: Secondary | ICD-10-CM | POA: Diagnosis not present

## 2013-02-26 DIAGNOSIS — R0789 Other chest pain: Secondary | ICD-10-CM | POA: Diagnosis not present

## 2013-02-28 DIAGNOSIS — I517 Cardiomegaly: Secondary | ICD-10-CM | POA: Diagnosis not present

## 2013-02-28 DIAGNOSIS — I259 Chronic ischemic heart disease, unspecified: Secondary | ICD-10-CM | POA: Diagnosis not present

## 2013-02-28 DIAGNOSIS — I10 Essential (primary) hypertension: Secondary | ICD-10-CM | POA: Diagnosis not present

## 2013-02-28 DIAGNOSIS — Z7902 Long term (current) use of antithrombotics/antiplatelets: Secondary | ICD-10-CM | POA: Diagnosis not present

## 2013-02-28 DIAGNOSIS — I209 Angina pectoris, unspecified: Secondary | ICD-10-CM | POA: Diagnosis not present

## 2013-02-28 DIAGNOSIS — G4733 Obstructive sleep apnea (adult) (pediatric): Secondary | ICD-10-CM | POA: Diagnosis not present

## 2013-02-28 DIAGNOSIS — Z7982 Long term (current) use of aspirin: Secondary | ICD-10-CM | POA: Diagnosis not present

## 2013-02-28 DIAGNOSIS — I739 Peripheral vascular disease, unspecified: Secondary | ICD-10-CM | POA: Diagnosis not present

## 2013-02-28 DIAGNOSIS — I2510 Atherosclerotic heart disease of native coronary artery without angina pectoris: Secondary | ICD-10-CM | POA: Diagnosis not present

## 2013-03-01 DIAGNOSIS — I2510 Atherosclerotic heart disease of native coronary artery without angina pectoris: Secondary | ICD-10-CM | POA: Diagnosis not present

## 2013-03-21 ENCOUNTER — Other Ambulatory Visit: Payer: Self-pay

## 2013-03-22 DIAGNOSIS — L259 Unspecified contact dermatitis, unspecified cause: Secondary | ICD-10-CM | POA: Diagnosis not present

## 2013-03-22 DIAGNOSIS — L738 Other specified follicular disorders: Secondary | ICD-10-CM | POA: Diagnosis not present

## 2013-04-10 DIAGNOSIS — E785 Hyperlipidemia, unspecified: Secondary | ICD-10-CM | POA: Diagnosis not present

## 2013-04-10 DIAGNOSIS — I2510 Atherosclerotic heart disease of native coronary artery without angina pectoris: Secondary | ICD-10-CM | POA: Diagnosis not present

## 2013-04-26 DIAGNOSIS — E785 Hyperlipidemia, unspecified: Secondary | ICD-10-CM | POA: Diagnosis not present

## 2013-04-26 DIAGNOSIS — I2510 Atherosclerotic heart disease of native coronary artery without angina pectoris: Secondary | ICD-10-CM | POA: Diagnosis not present

## 2013-04-26 DIAGNOSIS — I10 Essential (primary) hypertension: Secondary | ICD-10-CM | POA: Diagnosis not present

## 2013-05-03 DIAGNOSIS — E785 Hyperlipidemia, unspecified: Secondary | ICD-10-CM | POA: Diagnosis not present

## 2013-05-03 DIAGNOSIS — I2510 Atherosclerotic heart disease of native coronary artery without angina pectoris: Secondary | ICD-10-CM | POA: Diagnosis not present

## 2013-05-17 ENCOUNTER — Telehealth (HOSPITAL_COMMUNITY): Payer: Self-pay | Admitting: *Deleted

## 2013-05-20 ENCOUNTER — Other Ambulatory Visit (HOSPITAL_COMMUNITY): Payer: Self-pay | Admitting: Cardiovascular Disease

## 2013-05-20 DIAGNOSIS — I739 Peripheral vascular disease, unspecified: Secondary | ICD-10-CM

## 2013-06-04 DIAGNOSIS — R0789 Other chest pain: Secondary | ICD-10-CM | POA: Diagnosis not present

## 2013-06-05 ENCOUNTER — Ambulatory Visit (HOSPITAL_COMMUNITY)
Admission: RE | Admit: 2013-06-05 | Discharge: 2013-06-05 | Disposition: A | Discharge status: Home / Self Care | Payer: Medicare Other | Source: Ambulatory Visit | Attending: Cardiovascular Disease | Admitting: Cardiovascular Disease

## 2013-06-05 DIAGNOSIS — I739 Peripheral vascular disease, unspecified: Secondary | ICD-10-CM | POA: Diagnosis not present

## 2013-06-05 DIAGNOSIS — Z9889 Other specified postprocedural states: Secondary | ICD-10-CM | POA: Diagnosis not present

## 2013-06-05 DIAGNOSIS — Z09 Encounter for follow-up examination after completed treatment for conditions other than malignant neoplasm: Secondary | ICD-10-CM | POA: Insufficient documentation

## 2013-06-05 NOTE — Progress Notes (Signed)
Lower Extremity Arterial Duplex Completed. °Brianna L Mazza,RVT °

## 2013-06-19 ENCOUNTER — Encounter: Payer: Self-pay | Admitting: *Deleted

## 2013-06-19 ENCOUNTER — Telehealth: Payer: Self-pay | Admitting: *Deleted

## 2013-06-19 DIAGNOSIS — I739 Peripheral vascular disease, unspecified: Secondary | ICD-10-CM

## 2013-06-19 NOTE — Telephone Encounter (Signed)
Message copied by Chauncy Lean on Wed Jun 19, 2013  9:35 AM ------      Message from: Lorretta Harp      Created: Mon Jun 17, 2013  8:31 PM       No change from prior study. Repeat in 12 months. ------

## 2013-06-19 NOTE — Telephone Encounter (Signed)
Order placed for repeat lower ext doppler in 1 year

## 2013-06-25 DIAGNOSIS — I2510 Atherosclerotic heart disease of native coronary artery without angina pectoris: Secondary | ICD-10-CM | POA: Diagnosis not present

## 2013-06-25 DIAGNOSIS — I209 Angina pectoris, unspecified: Secondary | ICD-10-CM | POA: Diagnosis not present

## 2013-08-09 DIAGNOSIS — I10 Essential (primary) hypertension: Secondary | ICD-10-CM | POA: Diagnosis not present

## 2013-08-09 DIAGNOSIS — I2510 Atherosclerotic heart disease of native coronary artery without angina pectoris: Secondary | ICD-10-CM | POA: Diagnosis not present

## 2013-08-09 DIAGNOSIS — E785 Hyperlipidemia, unspecified: Secondary | ICD-10-CM | POA: Diagnosis not present

## 2013-09-26 DIAGNOSIS — L259 Unspecified contact dermatitis, unspecified cause: Secondary | ICD-10-CM | POA: Diagnosis not present

## 2013-09-26 DIAGNOSIS — L981 Factitial dermatitis: Secondary | ICD-10-CM | POA: Diagnosis not present

## 2013-10-22 DIAGNOSIS — L570 Actinic keratosis: Secondary | ICD-10-CM | POA: Diagnosis not present

## 2013-10-31 DIAGNOSIS — D696 Thrombocytopenia, unspecified: Secondary | ICD-10-CM | POA: Diagnosis not present

## 2013-10-31 DIAGNOSIS — I739 Peripheral vascular disease, unspecified: Secondary | ICD-10-CM | POA: Diagnosis not present

## 2013-10-31 DIAGNOSIS — E785 Hyperlipidemia, unspecified: Secondary | ICD-10-CM | POA: Diagnosis not present

## 2013-10-31 DIAGNOSIS — I10 Essential (primary) hypertension: Secondary | ICD-10-CM | POA: Diagnosis not present

## 2013-10-31 DIAGNOSIS — R7309 Other abnormal glucose: Secondary | ICD-10-CM | POA: Diagnosis not present

## 2013-10-31 DIAGNOSIS — I2510 Atherosclerotic heart disease of native coronary artery without angina pectoris: Secondary | ICD-10-CM | POA: Diagnosis not present

## 2013-11-04 DIAGNOSIS — I209 Angina pectoris, unspecified: Secondary | ICD-10-CM | POA: Diagnosis not present

## 2013-11-04 DIAGNOSIS — Z7902 Long term (current) use of antithrombotics/antiplatelets: Secondary | ICD-10-CM | POA: Diagnosis not present

## 2013-11-04 DIAGNOSIS — I2510 Atherosclerotic heart disease of native coronary artery without angina pectoris: Secondary | ICD-10-CM | POA: Diagnosis not present

## 2013-11-04 DIAGNOSIS — I10 Essential (primary) hypertension: Secondary | ICD-10-CM | POA: Diagnosis not present

## 2013-11-04 DIAGNOSIS — R0789 Other chest pain: Secondary | ICD-10-CM | POA: Diagnosis not present

## 2013-11-04 DIAGNOSIS — E785 Hyperlipidemia, unspecified: Secondary | ICD-10-CM | POA: Diagnosis not present

## 2013-11-05 DIAGNOSIS — Z7902 Long term (current) use of antithrombotics/antiplatelets: Secondary | ICD-10-CM | POA: Diagnosis not present

## 2013-11-05 DIAGNOSIS — E785 Hyperlipidemia, unspecified: Secondary | ICD-10-CM | POA: Diagnosis not present

## 2013-11-05 DIAGNOSIS — I2510 Atherosclerotic heart disease of native coronary artery without angina pectoris: Secondary | ICD-10-CM | POA: Diagnosis not present

## 2013-11-05 DIAGNOSIS — I209 Angina pectoris, unspecified: Secondary | ICD-10-CM | POA: Diagnosis not present

## 2013-11-05 DIAGNOSIS — Z9861 Coronary angioplasty status: Secondary | ICD-10-CM | POA: Diagnosis not present

## 2013-11-05 DIAGNOSIS — R0789 Other chest pain: Secondary | ICD-10-CM | POA: Diagnosis not present

## 2013-11-05 DIAGNOSIS — I10 Essential (primary) hypertension: Secondary | ICD-10-CM | POA: Diagnosis not present

## 2013-11-21 DIAGNOSIS — I10 Essential (primary) hypertension: Secondary | ICD-10-CM | POA: Diagnosis not present

## 2013-11-21 DIAGNOSIS — E785 Hyperlipidemia, unspecified: Secondary | ICD-10-CM | POA: Diagnosis not present

## 2013-11-21 DIAGNOSIS — D696 Thrombocytopenia, unspecified: Secondary | ICD-10-CM | POA: Diagnosis not present

## 2013-11-27 DIAGNOSIS — E785 Hyperlipidemia, unspecified: Secondary | ICD-10-CM | POA: Diagnosis not present

## 2013-12-30 DIAGNOSIS — I10 Essential (primary) hypertension: Secondary | ICD-10-CM | POA: Diagnosis not present

## 2013-12-30 DIAGNOSIS — I209 Angina pectoris, unspecified: Secondary | ICD-10-CM | POA: Diagnosis not present

## 2013-12-30 DIAGNOSIS — H6520 Chronic serous otitis media, unspecified ear: Secondary | ICD-10-CM | POA: Diagnosis not present

## 2013-12-30 DIAGNOSIS — I2510 Atherosclerotic heart disease of native coronary artery without angina pectoris: Secondary | ICD-10-CM | POA: Diagnosis not present

## 2013-12-30 DIAGNOSIS — E785 Hyperlipidemia, unspecified: Secondary | ICD-10-CM | POA: Diagnosis not present

## 2013-12-30 DIAGNOSIS — H6980 Other specified disorders of Eustachian tube, unspecified ear: Secondary | ICD-10-CM | POA: Diagnosis not present

## 2014-01-09 DIAGNOSIS — H2510 Age-related nuclear cataract, unspecified eye: Secondary | ICD-10-CM | POA: Diagnosis not present

## 2014-01-27 DIAGNOSIS — H2510 Age-related nuclear cataract, unspecified eye: Secondary | ICD-10-CM | POA: Diagnosis not present

## 2014-01-27 DIAGNOSIS — Z23 Encounter for immunization: Secondary | ICD-10-CM | POA: Diagnosis not present

## 2014-03-25 DIAGNOSIS — L300 Nummular dermatitis: Secondary | ICD-10-CM | POA: Diagnosis not present

## 2014-03-25 DIAGNOSIS — L299 Pruritus, unspecified: Secondary | ICD-10-CM | POA: Diagnosis not present

## 2014-04-02 DIAGNOSIS — H25012 Cortical age-related cataract, left eye: Secondary | ICD-10-CM | POA: Diagnosis not present

## 2014-04-02 DIAGNOSIS — H2512 Age-related nuclear cataract, left eye: Secondary | ICD-10-CM | POA: Diagnosis not present

## 2014-04-02 DIAGNOSIS — Z961 Presence of intraocular lens: Secondary | ICD-10-CM | POA: Diagnosis not present

## 2014-04-16 DIAGNOSIS — N400 Enlarged prostate without lower urinary tract symptoms: Secondary | ICD-10-CM | POA: Diagnosis not present

## 2014-04-16 DIAGNOSIS — R7309 Other abnormal glucose: Secondary | ICD-10-CM | POA: Diagnosis not present

## 2014-04-16 DIAGNOSIS — E785 Hyperlipidemia, unspecified: Secondary | ICD-10-CM | POA: Diagnosis not present

## 2014-04-16 DIAGNOSIS — N7001 Acute salpingitis: Secondary | ICD-10-CM | POA: Diagnosis not present

## 2014-04-16 DIAGNOSIS — I2510 Atherosclerotic heart disease of native coronary artery without angina pectoris: Secondary | ICD-10-CM | POA: Diagnosis not present

## 2014-04-16 DIAGNOSIS — Z1389 Encounter for screening for other disorder: Secondary | ICD-10-CM | POA: Diagnosis not present

## 2014-04-16 DIAGNOSIS — Z1212 Encounter for screening for malignant neoplasm of rectum: Secondary | ICD-10-CM | POA: Diagnosis not present

## 2014-04-16 DIAGNOSIS — I10 Essential (primary) hypertension: Secondary | ICD-10-CM | POA: Diagnosis not present

## 2014-04-22 DIAGNOSIS — H2512 Age-related nuclear cataract, left eye: Secondary | ICD-10-CM | POA: Diagnosis not present

## 2014-04-24 ENCOUNTER — Encounter (HOSPITAL_COMMUNITY): Payer: Self-pay | Admitting: Cardiovascular Disease

## 2014-05-13 ENCOUNTER — Telehealth (HOSPITAL_COMMUNITY): Payer: Self-pay | Admitting: *Deleted

## 2014-05-20 ENCOUNTER — Telehealth (HOSPITAL_COMMUNITY): Payer: Self-pay | Admitting: *Deleted

## 2014-06-05 ENCOUNTER — Ambulatory Visit (HOSPITAL_COMMUNITY)
Admission: RE | Admit: 2014-06-05 | Discharge: 2014-06-05 | Disposition: A | Discharge status: Home / Self Care | Payer: Medicare Other | Source: Ambulatory Visit | Attending: Cardiology | Admitting: Cardiology

## 2014-06-05 DIAGNOSIS — I739 Peripheral vascular disease, unspecified: Secondary | ICD-10-CM | POA: Insufficient documentation

## 2014-06-05 NOTE — Progress Notes (Signed)
Arterial Duplex Lower Ext. Completed. Nilda Keathley, BS, RDMS, RVT  

## 2014-07-21 DIAGNOSIS — R7309 Other abnormal glucose: Secondary | ICD-10-CM | POA: Diagnosis not present

## 2014-09-23 DIAGNOSIS — L300 Nummular dermatitis: Secondary | ICD-10-CM | POA: Diagnosis not present

## 2014-09-23 DIAGNOSIS — L570 Actinic keratosis: Secondary | ICD-10-CM | POA: Diagnosis not present

## 2014-09-23 DIAGNOSIS — L299 Pruritus, unspecified: Secondary | ICD-10-CM | POA: Diagnosis not present

## 2014-10-20 ENCOUNTER — Telehealth: Payer: Self-pay | Admitting: Cardiovascular Disease

## 2014-10-20 NOTE — Telephone Encounter (Signed)
Patient states Dr. Gwenlyn Found placed stent in his leg a couple of years ago.  Now it feels like he has a "deep bruise" in that area.  Please call.

## 2014-10-20 NOTE — Telephone Encounter (Signed)
Spoke to patient.  Hx of left SFA claudication, stented 2 yrs ago w/ good response. Most recent doppler Jan 2016 showed unchanged from prior year study, recommended repeat in 12 months.  Pt states ~1 month ago, noted feeling on lateral left upper thigh "like a deep muscle bruise". Noted possibly hit leg during work/physical labor, however does not recall specific event.  He had waited for this to improve, but still notes soreness 1 month later.  He is compliant w/ ASA & Plavix. He does not note visible bruising or abnormal color to site. Notes general soreness at site, but no unusual cramping/pain.   I noted pt is overdue for OV - set up to see Dr. Gwenlyn Found in July. He is agreeable to any other additional recommendations, if repeat arterial doppler or other studies recommended.

## 2014-10-22 NOTE — Telephone Encounter (Signed)
Called and spoke to patient - recommendations communicated to him - he verbalized acknowledgement and understanding. Thanks [place] for the call.

## 2014-10-22 NOTE — Telephone Encounter (Signed)
We'll see in July return office visit

## 2014-11-24 ENCOUNTER — Telehealth: Payer: Self-pay | Admitting: Cardiovascular Disease

## 2014-11-24 NOTE — Telephone Encounter (Signed)
Mr. Daniel Adkins is calling because he needs to speak with someone about what he is going thru right now and to see if he still wants him to make that appt  Would like to speak with Daniel Adkins  Please call  Thanks

## 2014-11-24 NOTE — Telephone Encounter (Signed)
Pt reports symptoms from June better, walking more, still having some pain in thighs and ankles but thinks is more muscle cramps than anything. Just wanted to give update on his status in advance of Friday appt.

## 2014-11-28 ENCOUNTER — Ambulatory Visit (INDEPENDENT_AMBULATORY_CARE_PROVIDER_SITE_OTHER): Payer: Medicare Other | Admitting: Cardiovascular Disease

## 2014-11-28 ENCOUNTER — Encounter: Payer: Self-pay | Admitting: Cardiovascular Disease

## 2014-11-28 VITALS — BP 104/66 | HR 71 | Ht 70.0 in | Wt 249.0 lb

## 2014-11-28 DIAGNOSIS — I2510 Atherosclerotic heart disease of native coronary artery without angina pectoris: Secondary | ICD-10-CM

## 2014-11-28 DIAGNOSIS — E785 Hyperlipidemia, unspecified: Secondary | ICD-10-CM | POA: Diagnosis not present

## 2014-11-28 DIAGNOSIS — I10 Essential (primary) hypertension: Secondary | ICD-10-CM | POA: Diagnosis not present

## 2014-11-28 DIAGNOSIS — I739 Peripheral vascular disease, unspecified: Secondary | ICD-10-CM | POA: Diagnosis not present

## 2014-11-28 NOTE — Assessment & Plan Note (Signed)
History of coronary artery disease status post intervention by Dr. Atilano Median with 3 stents placed last year. The patient is currently asymptomatic.

## 2014-11-28 NOTE — Assessment & Plan Note (Signed)
History of peripheral vascular disease status post diamondback orbital rotational atherectomy, PTCA and stenting of a highly calcified distal left SFA 10/30/12. He did have 2 vessel runoff bilaterally with moderate right SFA disease as well. He was complaining of some atypical left thigh pain. Lower extremity Dopplers performed 06/05/14 revealed normal ABIs with patent stent. The pain has since resolved and I suspect this was not vascular in nature.

## 2014-11-28 NOTE — Assessment & Plan Note (Signed)
History of hyperlipidemia on Crestor 10 mg a day followed by his PCP

## 2014-11-28 NOTE — Assessment & Plan Note (Signed)
History of hypertension blood pressure measured at 104/66. He is on lisinopril 20 mg a day. Continue current meds at current dosing

## 2014-11-28 NOTE — Progress Notes (Signed)
11/28/2014 [name]   [birth year]  [id number]  Primary Physician Townsend Roger, MD Primary Cardiologist: Lorretta Harp MD Renae Gloss   HPI:  Mr. 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 Mellody Memos for peripheral vascular evaluation because of left calf lifestyle limiting claudication. I last saw him in the office 11/22/12.His risk factors include 50-pack-year history of tobacco abuse having quit 35 years ago, hyperlipidemia and hypertension treated medically. His father died at age 82 of a myocardial infarction. He has never had a heart attack or stroke. He does have known coronary artery disease by cath back in 2001 which has remained fairly stable. This is followed by Dr. Alethia Berthold at [facility]. He says that he gets left hip as well as calf claudication with ambulation which is lifestyle limiting. He had a CT angiogram performed which showed occlusion of the distal left SFA which was calcific and reconstituted above the knee in the popliteal artery. A Doppler study in our office confirm this. The patient underwent angiography and percutaneous intervention for lifestyle limiting claudication of his left SFA using diamondback orbital rotational atherectomy, PTA and stenting using a IDEV stent. His claudication resolved after this. His lower extremity arterial Dopplers performed in followup on 11/15/12 revealed a left ABI of 1.1 with a widely patent left SFA. Since I saw him in the office 2 years ago he's undergone stenting of his coronary arteries by Dr. Dani Gobble in [facility]. He was complaining of some left thigh discomfort and had Dopplers performed in January of this year that showed normal ABIs with a patent stent. The pain has since resolved.   Current Outpatient Prescriptions  Medication Sig Dispense Refill  .  aspirin EC 81 MG tablet Take 81 mg by mouth daily.    . clopidogrel (PLAVIX) 75 MG tablet Take 1 tablet (75 mg total) by mouth daily with breakfast. 30 tablet 11  . DUREZOL 0.05 % EMUL Place 1 drop into the right eye daily.    Marland Kitchen lisinopril (PRINIVIL,ZESTRIL) 20 MG tablet Take 20 mg by mouth daily.    . rosuvastatin (CRESTOR) 10 MG tablet Take 10 mg by mouth daily.     No current facility-administered medications for this visit.    Allergies  Allergen Reactions  . Cephalosporins Anaphylaxis  . Sulfa Antibiotics     Other reaction(s): Unknown    History   Social History  . Marital Status: Single    Spouse Name: N/A  . Number of Children: N/A  . Years of Education: N/A   Occupational History  . Not on file.   Social History Main Topics  . Smoking status: Former Smoker -- 37 years    Types: Cigars    Quit date: 10/19/1995  . Smokeless tobacco: Never Used     Comment: 10/30/2012 "last smoked 5 swisher sweets/day when I quit smoking"  . Alcohol Use: Yes     Comment: 10/30/2012 "mixed drink usually once/month; sometimes twice"  . Drug Use: No  . Sexual Activity: Yes   Other Topics Concern  . Not on file   Social History Narrative     Review of Systems: General: negative for chills, fever, night sweats or weight changes.  Cardiovascular: negative for chest pain, dyspnea on exertion, edema, orthopnea, palpitations, paroxysmal nocturnal dyspnea or shortness of breath Dermatological: negative for rash Respiratory: negative for  cough or wheezing Urologic: negative for hematuria Abdominal: negative for nausea, vomiting, diarrhea, bright red blood per rectum, melena, or hematemesis Neurologic: negative for visual changes, syncope, or dizziness All other systems reviewed and are otherwise negative except as noted above.    Blood pressure 104/66, pulse 71, height 5\' 10"  (1.778 m), weight 249 lb (112.946 kg).  General appearance: alert and no distress Neck: no adenopathy, no  carotid bruit, no JVD, supple, symmetrical, trachea midline and thyroid not enlarged, symmetric, no tenderness/mass/nodules Lungs: clear to auscultation bilaterally Heart: regular rate and rhythm, S1, S2 normal, no murmur, click, rub or gallop Extremities: extremities normal, atraumatic, no cyanosis or edema  EKG normal sinus rhythm at 71 without ST or T-wave changes. I personally reviewed this EKG  ASSESSMENT AND PLAN:   Peripheral vascular disease with claudication History of peripheral vascular disease status post diamondback orbital rotational atherectomy, PTCA and stenting of a highly calcified distal left SFA 10/30/12. He did have 2 vessel runoff bilaterally with moderate right SFA disease as well. He was complaining of some atypical left thigh pain. Lower extremity Dopplers performed 06/05/14 revealed normal ABIs with patent stent. The pain has since resolved and I suspect this was not vascular in nature.  Hypertension History of hypertension blood pressure measured at 104/66. He is on lisinopril 20 mg a day. Continue current meds at current dosing  Hyperlipidemia History of hyperlipidemia on Crestor 10 mg a day followed by his PCP  Coronary artery disease History of coronary artery disease status post intervention by Dr. Atilano Median with 3 stents placed last year. The patient is currently asymptomatic.      Lorretta Harp MD FACP,FACC,FAHA, [facility] 11/28/2014 10:12 AM

## 2014-11-28 NOTE — Patient Instructions (Signed)
Dr Gwenlyn Found has requested that you have a lower extremity arterial duplex in January. This test is an ultrasound of the arteries in the legs. It looks at arterial blood flow in the legs. Allow one hour for Lower Arterial scans. There are no restrictions or special instructions.  Dr Gwenlyn Found recommends that you schedule a follow-up appointment in 1 year. You will receive a reminder letter in the mail two months in advance. If you don't receive a letter, please call our office to schedule the follow-up appointment.

## 2015-01-13 DIAGNOSIS — M150 Primary generalized (osteo)arthritis: Secondary | ICD-10-CM | POA: Diagnosis not present

## 2015-01-13 DIAGNOSIS — N400 Enlarged prostate without lower urinary tract symptoms: Secondary | ICD-10-CM | POA: Diagnosis not present

## 2015-01-13 DIAGNOSIS — Z1212 Encounter for screening for malignant neoplasm of rectum: Secondary | ICD-10-CM | POA: Diagnosis not present

## 2015-01-13 DIAGNOSIS — I2510 Atherosclerotic heart disease of native coronary artery without angina pectoris: Secondary | ICD-10-CM | POA: Diagnosis not present

## 2015-01-13 DIAGNOSIS — Z1389 Encounter for screening for other disorder: Secondary | ICD-10-CM | POA: Diagnosis not present

## 2015-01-13 DIAGNOSIS — Z23 Encounter for immunization: Secondary | ICD-10-CM | POA: Diagnosis not present

## 2015-01-13 DIAGNOSIS — I739 Peripheral vascular disease, unspecified: Secondary | ICD-10-CM | POA: Diagnosis not present

## 2015-01-13 DIAGNOSIS — E291 Testicular hypofunction: Secondary | ICD-10-CM | POA: Diagnosis not present

## 2015-01-13 DIAGNOSIS — H9211 Otorrhea, right ear: Secondary | ICD-10-CM | POA: Diagnosis not present

## 2015-01-13 DIAGNOSIS — R7309 Other abnormal glucose: Secondary | ICD-10-CM | POA: Diagnosis not present

## 2015-01-13 DIAGNOSIS — R3911 Hesitancy of micturition: Secondary | ICD-10-CM | POA: Diagnosis not present

## 2015-01-13 DIAGNOSIS — G4733 Obstructive sleep apnea (adult) (pediatric): Secondary | ICD-10-CM | POA: Diagnosis not present

## 2015-01-13 DIAGNOSIS — I10 Essential (primary) hypertension: Secondary | ICD-10-CM | POA: Diagnosis not present

## 2015-01-16 DIAGNOSIS — H9211 Otorrhea, right ear: Secondary | ICD-10-CM | POA: Diagnosis not present

## 2015-01-20 DIAGNOSIS — H9211 Otorrhea, right ear: Secondary | ICD-10-CM | POA: Diagnosis not present

## 2015-01-20 DIAGNOSIS — B369 Superficial mycosis, unspecified: Secondary | ICD-10-CM | POA: Diagnosis not present

## 2015-01-28 DIAGNOSIS — H7291 Unspecified perforation of tympanic membrane, right ear: Secondary | ICD-10-CM | POA: Diagnosis not present

## 2015-01-28 DIAGNOSIS — H9210 Otorrhea, unspecified ear: Secondary | ICD-10-CM | POA: Diagnosis not present

## 2015-02-02 DIAGNOSIS — H7291 Unspecified perforation of tympanic membrane, right ear: Secondary | ICD-10-CM | POA: Diagnosis not present

## 2015-02-02 DIAGNOSIS — H9211 Otorrhea, right ear: Secondary | ICD-10-CM | POA: Diagnosis not present

## 2015-03-18 DIAGNOSIS — H6981 Other specified disorders of Eustachian tube, right ear: Secondary | ICD-10-CM | POA: Diagnosis not present

## 2015-03-18 DIAGNOSIS — H7291 Unspecified perforation of tympanic membrane, right ear: Secondary | ICD-10-CM | POA: Diagnosis not present

## 2015-03-27 DIAGNOSIS — H6981 Other specified disorders of Eustachian tube, right ear: Secondary | ICD-10-CM | POA: Diagnosis not present

## 2015-04-08 DIAGNOSIS — R319 Hematuria, unspecified: Secondary | ICD-10-CM | POA: Diagnosis not present

## 2015-04-08 DIAGNOSIS — I10 Essential (primary) hypertension: Secondary | ICD-10-CM | POA: Diagnosis not present

## 2015-04-10 DIAGNOSIS — R3121 Asymptomatic microscopic hematuria: Secondary | ICD-10-CM | POA: Diagnosis not present

## 2015-04-10 DIAGNOSIS — I10 Essential (primary) hypertension: Secondary | ICD-10-CM | POA: Diagnosis not present

## 2015-04-10 DIAGNOSIS — N23 Unspecified renal colic: Secondary | ICD-10-CM | POA: Diagnosis not present

## 2015-04-10 DIAGNOSIS — N200 Calculus of kidney: Secondary | ICD-10-CM | POA: Diagnosis not present

## 2015-04-10 DIAGNOSIS — N3001 Acute cystitis with hematuria: Secondary | ICD-10-CM | POA: Diagnosis not present

## 2015-04-16 DIAGNOSIS — Z7902 Long term (current) use of antithrombotics/antiplatelets: Secondary | ICD-10-CM | POA: Diagnosis not present

## 2015-04-16 DIAGNOSIS — E669 Obesity, unspecified: Secondary | ICD-10-CM | POA: Diagnosis not present

## 2015-04-16 DIAGNOSIS — N201 Calculus of ureter: Secondary | ICD-10-CM | POA: Diagnosis not present

## 2015-04-16 DIAGNOSIS — Z951 Presence of aortocoronary bypass graft: Secondary | ICD-10-CM | POA: Diagnosis not present

## 2015-04-16 DIAGNOSIS — Z79899 Other long term (current) drug therapy: Secondary | ICD-10-CM | POA: Diagnosis not present

## 2015-04-16 DIAGNOSIS — N200 Calculus of kidney: Secondary | ICD-10-CM | POA: Diagnosis not present

## 2015-04-16 DIAGNOSIS — I10 Essential (primary) hypertension: Secondary | ICD-10-CM | POA: Diagnosis not present

## 2015-04-16 DIAGNOSIS — R319 Hematuria, unspecified: Secondary | ICD-10-CM | POA: Diagnosis not present

## 2015-04-16 DIAGNOSIS — G4733 Obstructive sleep apnea (adult) (pediatric): Secondary | ICD-10-CM | POA: Diagnosis not present

## 2015-04-16 DIAGNOSIS — Z6835 Body mass index (BMI) 35.0-35.9, adult: Secondary | ICD-10-CM | POA: Diagnosis not present

## 2015-04-17 DIAGNOSIS — Z7982 Long term (current) use of aspirin: Secondary | ICD-10-CM | POA: Diagnosis not present

## 2015-04-17 DIAGNOSIS — Z7902 Long term (current) use of antithrombotics/antiplatelets: Secondary | ICD-10-CM | POA: Diagnosis not present

## 2015-04-17 DIAGNOSIS — I10 Essential (primary) hypertension: Secondary | ICD-10-CM | POA: Diagnosis not present

## 2015-04-17 DIAGNOSIS — G4730 Sleep apnea, unspecified: Secondary | ICD-10-CM | POA: Diagnosis not present

## 2015-04-17 DIAGNOSIS — I209 Angina pectoris, unspecified: Secondary | ICD-10-CM | POA: Diagnosis not present

## 2015-04-17 DIAGNOSIS — N200 Calculus of kidney: Secondary | ICD-10-CM | POA: Diagnosis not present

## 2015-04-22 DIAGNOSIS — H6981 Other specified disorders of Eustachian tube, right ear: Secondary | ICD-10-CM | POA: Diagnosis not present

## 2015-04-24 DIAGNOSIS — N200 Calculus of kidney: Secondary | ICD-10-CM | POA: Diagnosis not present

## 2015-04-24 DIAGNOSIS — I10 Essential (primary) hypertension: Secondary | ICD-10-CM | POA: Diagnosis not present

## 2015-04-24 DIAGNOSIS — R3121 Asymptomatic microscopic hematuria: Secondary | ICD-10-CM | POA: Diagnosis not present

## 2015-04-24 DIAGNOSIS — N3021 Other chronic cystitis with hematuria: Secondary | ICD-10-CM | POA: Diagnosis not present

## 2015-04-24 DIAGNOSIS — N23 Unspecified renal colic: Secondary | ICD-10-CM | POA: Diagnosis not present

## 2015-05-12 DIAGNOSIS — Z23 Encounter for immunization: Secondary | ICD-10-CM | POA: Diagnosis not present

## 2015-05-19 DIAGNOSIS — H9191 Unspecified hearing loss, right ear: Secondary | ICD-10-CM | POA: Diagnosis not present

## 2015-06-01 DIAGNOSIS — L300 Nummular dermatitis: Secondary | ICD-10-CM | POA: Diagnosis not present

## 2015-06-01 DIAGNOSIS — L299 Pruritus, unspecified: Secondary | ICD-10-CM | POA: Diagnosis not present

## 2015-06-15 DIAGNOSIS — N23 Unspecified renal colic: Secondary | ICD-10-CM | POA: Diagnosis not present

## 2015-06-15 DIAGNOSIS — R3121 Asymptomatic microscopic hematuria: Secondary | ICD-10-CM | POA: Diagnosis not present

## 2015-06-15 DIAGNOSIS — N200 Calculus of kidney: Secondary | ICD-10-CM | POA: Diagnosis not present

## 2015-06-15 DIAGNOSIS — I10 Essential (primary) hypertension: Secondary | ICD-10-CM | POA: Diagnosis not present

## 2015-06-15 DIAGNOSIS — N3021 Other chronic cystitis with hematuria: Secondary | ICD-10-CM | POA: Diagnosis not present

## 2015-06-29 ENCOUNTER — Other Ambulatory Visit: Payer: Self-pay | Admitting: Cardiovascular Disease

## 2015-06-29 DIAGNOSIS — I739 Peripheral vascular disease, unspecified: Secondary | ICD-10-CM

## 2015-07-06 ENCOUNTER — Ambulatory Visit (HOSPITAL_COMMUNITY)
Admission: RE | Admit: 2015-07-06 | Discharge: 2015-07-06 | Disposition: A | Discharge status: Home / Self Care | Payer: Medicare Other | Source: Ambulatory Visit | Attending: Cardiovascular Disease | Admitting: Cardiovascular Disease

## 2015-07-06 DIAGNOSIS — I739 Peripheral vascular disease, unspecified: Secondary | ICD-10-CM | POA: Diagnosis not present

## 2015-07-06 DIAGNOSIS — I10 Essential (primary) hypertension: Secondary | ICD-10-CM | POA: Diagnosis not present

## 2015-07-06 DIAGNOSIS — E785 Hyperlipidemia, unspecified: Secondary | ICD-10-CM | POA: Insufficient documentation

## 2015-07-06 DIAGNOSIS — G4733 Obstructive sleep apnea (adult) (pediatric): Secondary | ICD-10-CM | POA: Insufficient documentation

## 2015-07-10 ENCOUNTER — Telehealth: Payer: Self-pay

## 2015-07-10 ENCOUNTER — Telehealth: Payer: Self-pay | Admitting: Cardiovascular Disease

## 2015-07-10 DIAGNOSIS — I739 Peripheral vascular disease, unspecified: Secondary | ICD-10-CM

## 2015-07-10 NOTE — Telephone Encounter (Signed)
-----   Message from Lorretta Harp, MD sent at 07/07/2015  7:31 PM EST ----- No change from prior study. Repeat in 12 months.

## 2015-07-10 NOTE — Telephone Encounter (Signed)
Pt calling re test results on doppler-[phone]

## 2015-08-06 DIAGNOSIS — N23 Unspecified renal colic: Secondary | ICD-10-CM | POA: Diagnosis not present

## 2015-08-06 DIAGNOSIS — N3020 Other chronic cystitis without hematuria: Secondary | ICD-10-CM | POA: Diagnosis not present

## 2015-08-06 DIAGNOSIS — N200 Calculus of kidney: Secondary | ICD-10-CM | POA: Diagnosis not present

## 2015-08-06 DIAGNOSIS — I10 Essential (primary) hypertension: Secondary | ICD-10-CM | POA: Diagnosis not present

## 2015-08-19 DIAGNOSIS — H7291 Unspecified perforation of tympanic membrane, right ear: Secondary | ICD-10-CM | POA: Diagnosis not present

## 2015-09-21 DIAGNOSIS — H905 Unspecified sensorineural hearing loss: Secondary | ICD-10-CM | POA: Diagnosis not present

## 2015-09-21 DIAGNOSIS — H7291 Unspecified perforation of tympanic membrane, right ear: Secondary | ICD-10-CM | POA: Diagnosis not present

## 2015-09-21 DIAGNOSIS — H6981 Other specified disorders of Eustachian tube, right ear: Secondary | ICD-10-CM | POA: Diagnosis not present

## 2015-09-21 DIAGNOSIS — H6691 Otitis media, unspecified, right ear: Secondary | ICD-10-CM | POA: Diagnosis not present

## 2015-10-06 DIAGNOSIS — H02825 Cysts of left lower eyelid: Secondary | ICD-10-CM | POA: Diagnosis not present

## 2015-10-08 DIAGNOSIS — H6981 Other specified disorders of Eustachian tube, right ear: Secondary | ICD-10-CM | POA: Diagnosis not present

## 2015-10-08 DIAGNOSIS — H9211 Otorrhea, right ear: Secondary | ICD-10-CM | POA: Diagnosis not present

## 2015-10-08 DIAGNOSIS — J343 Hypertrophy of nasal turbinates: Secondary | ICD-10-CM | POA: Diagnosis not present

## 2015-10-08 DIAGNOSIS — H7201 Central perforation of tympanic membrane, right ear: Secondary | ICD-10-CM | POA: Diagnosis not present

## 2015-10-13 DIAGNOSIS — L300 Nummular dermatitis: Secondary | ICD-10-CM | POA: Diagnosis not present

## 2015-10-13 DIAGNOSIS — L299 Pruritus, unspecified: Secondary | ICD-10-CM | POA: Diagnosis not present

## 2015-10-13 DIAGNOSIS — D1801 Hemangioma of skin and subcutaneous tissue: Secondary | ICD-10-CM | POA: Diagnosis not present

## 2015-10-19 ENCOUNTER — Telehealth: Payer: Self-pay | Admitting: Cardiovascular Disease

## 2015-10-19 NOTE — Telephone Encounter (Signed)
New Message   per recall pt wants to know if he has to come in.  He wants rn to call him

## 2015-10-19 NOTE — Telephone Encounter (Signed)
Patient states he is not having any issues at present time. He states he did not have doppler schedule for this year. He wanted to know if he needed to see Dr Gwenlyn Found.  RN informed patient ,he has an recall for annual visit for 11/2015.  1 - option is to have doppler schedule prior to appointment.  2 -option is have schedule appointment, and wait to see if doppler is needed .  Patient preferred to have office appointment first . Schedule appointment 12/02/15 at 3:30 Patient aware verbalized understanding.

## 2015-10-29 DIAGNOSIS — M25561 Pain in right knee: Secondary | ICD-10-CM | POA: Diagnosis not present

## 2015-10-29 DIAGNOSIS — M25461 Effusion, right knee: Secondary | ICD-10-CM | POA: Diagnosis not present

## 2015-10-30 DIAGNOSIS — M25561 Pain in right knee: Secondary | ICD-10-CM | POA: Diagnosis not present

## 2015-10-31 ENCOUNTER — Other Ambulatory Visit: Payer: Self-pay | Admitting: Orthopaedic Surgery

## 2015-10-31 DIAGNOSIS — M4802 Spinal stenosis, cervical region: Secondary | ICD-10-CM

## 2015-10-31 DIAGNOSIS — R29898 Other symptoms and signs involving the musculoskeletal system: Secondary | ICD-10-CM

## 2015-11-23 ENCOUNTER — Telehealth: Payer: Self-pay

## 2015-11-23 NOTE — Telephone Encounter (Signed)
Left message for patient to update medical history, awaiting a call back from patient

## 2015-11-24 NOTE — Telephone Encounter (Signed)
Returning your call from yesterday. He has a dentist appt at 67 today.

## 2015-12-01 DIAGNOSIS — I25119 Atherosclerotic heart disease of native coronary artery with unspecified angina pectoris: Secondary | ICD-10-CM | POA: Diagnosis not present

## 2015-12-01 DIAGNOSIS — E785 Hyperlipidemia, unspecified: Secondary | ICD-10-CM | POA: Diagnosis not present

## 2015-12-01 DIAGNOSIS — Z6835 Body mass index (BMI) 35.0-35.9, adult: Secondary | ICD-10-CM | POA: Diagnosis not present

## 2015-12-02 ENCOUNTER — Encounter: Payer: Self-pay | Admitting: Cardiovascular Disease

## 2015-12-02 ENCOUNTER — Ambulatory Visit (INDEPENDENT_AMBULATORY_CARE_PROVIDER_SITE_OTHER): Payer: Medicare Other | Admitting: Cardiovascular Disease

## 2015-12-02 VITALS — BP 108/60 | HR 86 | Ht 70.0 in | Wt 245.0 lb

## 2015-12-02 DIAGNOSIS — E785 Hyperlipidemia, unspecified: Secondary | ICD-10-CM

## 2015-12-02 DIAGNOSIS — R079 Chest pain, unspecified: Secondary | ICD-10-CM | POA: Diagnosis not present

## 2015-12-02 DIAGNOSIS — I2510 Atherosclerotic heart disease of native coronary artery without angina pectoris: Secondary | ICD-10-CM

## 2015-12-02 DIAGNOSIS — I2583 Coronary atherosclerosis due to lipid rich plaque: Secondary | ICD-10-CM

## 2015-12-02 DIAGNOSIS — I10 Essential (primary) hypertension: Secondary | ICD-10-CM

## 2015-12-02 DIAGNOSIS — I739 Peripheral vascular disease, unspecified: Secondary | ICD-10-CM

## 2015-12-02 DIAGNOSIS — R0602 Shortness of breath: Secondary | ICD-10-CM | POA: Diagnosis not present

## 2015-12-02 NOTE — Assessment & Plan Note (Signed)
History of peripheral arterial disease status post diamondback orbital rotation atherectomy, PTCA and stenting using an IgE V stent by myself 10/30/12. I reduced the 95% stenosis to 0% residual. He did have 2 vessel runoff bilaterally with occluded anterior tibials and a 40-50% segmental distal right SFA stenosis. Currently denies claudication. His most recent Dopplers performed 07/06/15 revealed a widely patent stent on the left.

## 2015-12-02 NOTE — Assessment & Plan Note (Signed)
History of hypertension blood pressure measured at 108/60. He is on lisinopril. Continue current meds at current dosing

## 2015-12-02 NOTE — Progress Notes (Signed)
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 Mellody Memos for peripheral vascular evaluation because of left calf lifestyle limiting claudication. I last saw him in the office 11/28/14.Marland KitchenHis risk factors include 50-pack-year history of tobacco abuse having quit 35 years ago, hyperlipidemia and hypertension treated medically. His father died at age 65 of a myocardial infarction. He has never had a heart attack or stroke. He does have known coronary artery disease by cath back in 2001 which has remained fairly stable. This is followed by Dr. Alethia Berthold at [facility] He says that he gets left hip as well as calf claudication with ambulation which is lifestyle limiting. He had a CT angiogram performed which showed occlusion of the distal left SFA which was calcific and reconstituted above the knee in the popliteal artery. A Doppler study in our office confirm this. The patient underwent angiography and percutaneous intervention for lifestyle limiting claudication of his left SFA using diamondback orbital rotational atherectomy, PTA and stenting using a IDEV stent. His claudication resolved after this. His lower extremity arterial Dopplers performed in followup on 11/15/12 revealed a left ABI of 1.1 with a widely patent left SFA. Since I saw him in the office 2 years ago he's undergone stenting of his coronary arteries by Dr. Atilano Median in [facility]. He was complaining of some left thigh discomfort and had Dopplers performed in January of this year that showed normal ABIs with a patent stent. The pain has since resolved. Since I saw him back a year ago he's remained cardiac stable. He has noticed  some mild dyspnea on exertion and apparently scheduled for cardiac catheterization next month in [facility]. His lower extremity arterial Doppler studies performed 07/06/15 revealed a widely patent left SFA stent.   Current Outpatient Prescriptions  Medication Sig Dispense Refill  . aspirin EC 81 MG tablet Take 81 mg by mouth daily.    . clopidogrel (PLAVIX) 75 MG tablet Take 1 tablet (75 mg total) by mouth daily with breakfast. 30 tablet 11  . lisinopril (PRINIVIL,ZESTRIL) 20 MG tablet Take 20 mg by mouth daily.    . Niacin (VITAMIN B-3 PO) Take 1 tablet by mouth daily.    . rosuvastatin (CRESTOR) 10 MG tablet Take 10 mg by mouth daily.    . Thiamine HCl (VITAMIN B-1 PO) Take 1 tablet by mouth daily.     No current facility-administered medications for this visit.    Allergies  Allergen Reactions  . Cephalosporins Anaphylaxis  . Sulfa Antibiotics     Other reaction(s): Unknown    Social History   Social History  . Marital Status: Single    Spouse Name: N/A  . Number of Children: N/A  . Years of Education: N/A   Occupational History  . Not on file.   Social History Main Topics  . Smoking status: Former Smoker -- 37 years    Types: Cigars    Quit date: 10/19/1995  . Smokeless tobacco: Never Used     Comment: 10/30/2012 "last smoked 5 swisher sweets/day when I quit smoking"  . Alcohol Use: Yes     Comment: 10/30/2012 "mixed drink usually once/month; sometimes twice"  . Drug Use: No  .  Sexual Activity: Yes   Other Topics Concern  . Not on file   Social History Narrative     Review of Systems: General: negative for chills, fever, night sweats or weight changes.  Cardiovascular: negative for chest pain, dyspnea on exertion, edema, orthopnea, palpitations, paroxysmal nocturnal dyspnea or shortness of breath Dermatological: negative for rash Respiratory: negative for cough or wheezing Urologic: negative for hematuria Abdominal: negative for nausea, vomiting, diarrhea,  bright red blood per rectum, melena, or hematemesis Neurologic: negative for visual changes, syncope, or dizziness All other systems reviewed and are otherwise negative except as noted above.    Blood pressure 108/60, pulse 86, height 5\' 10"  (1.778 m), weight 245 lb (111.131 kg).  General appearance: alert and no distress Neck: no adenopathy, no carotid bruit, no JVD, supple, symmetrical, trachea midline and thyroid not enlarged, symmetric, no tenderness/mass/nodules Lungs: clear to auscultation bilaterally Heart: regular rate and rhythm, S1, S2 normal, no murmur, click, rub or gallop Extremities: extremities normal, atraumatic, no cyanosis or edema  EKG normal sinus rhythm at 86 for ST or T wave changes. I personally reviewed this EKG  ASSESSMENT AND PLAN:   Hypertension History of hypertension blood pressure measured at 108/60. He is on lisinopril. Continue current meds at current dosing  Peripheral vascular disease with claudication History of peripheral arterial disease status post diamondback orbital rotation atherectomy, PTCA and stenting using an IgE V stent by myself 10/30/12. I reduced the 95% stenosis to 0% residual. He did have 2 vessel runoff bilaterally with occluded anterior tibials and a 40-50% segmental distal right SFA stenosis. Currently denies claudication. His most recent Dopplers performed 07/06/15 revealed a widely patent stent on the left.  Hyperlipidemia History of hyperlipidemia on statin therapy followed by his PCP  Coronary artery disease History of CAD status post coronary intervention by Dr. Atilano Median in [facility] several years ago. He has noticed some dyspnea but denies chest pain and apparently is scheduled for cardiac catheterization to further define his anatomy next month.      Lorretta Harp MD FACP,FACC,FAHA, [facility] 12/02/2015 4:08 PM

## 2015-12-02 NOTE — Patient Instructions (Signed)
Medication Instructions:  Your physician recommends that you continue on your current medications as directed. Please refer to the Current Medication list given to you today.   Testing/Procedures: Your physician has requested that you have a lower extremity arterial doppler- During this test, ultrasound is used to evaluate arterial blood flow in the legs. Allow approximately one hour for this exam. IN February 2018.   Follow-Up: Your physician wants you to follow-up in: [place]. You will receive a reminder letter in the mail two months in advance. If you don't receive a letter, please call our office to schedule the follow-up appointment.   If you need a refill on your cardiac medications before your next appointment, please call your pharmacy.

## 2015-12-02 NOTE — Assessment & Plan Note (Signed)
History of CAD status post coronary intervention by Dr. Atilano Median in [facility] several years ago. He has noticed some dyspnea but denies chest pain and apparently is scheduled for cardiac catheterization to further define his anatomy next month.

## 2015-12-02 NOTE — Assessment & Plan Note (Signed)
History of hyperlipidemia on statin therapy followed by his PCP 

## 2015-12-22 DIAGNOSIS — I10 Essential (primary) hypertension: Secondary | ICD-10-CM | POA: Diagnosis not present

## 2015-12-22 DIAGNOSIS — I200 Unstable angina: Secondary | ICD-10-CM | POA: Diagnosis not present

## 2015-12-22 DIAGNOSIS — E785 Hyperlipidemia, unspecified: Secondary | ICD-10-CM | POA: Diagnosis not present

## 2015-12-22 DIAGNOSIS — Z87891 Personal history of nicotine dependence: Secondary | ICD-10-CM | POA: Diagnosis not present

## 2015-12-22 DIAGNOSIS — R7303 Prediabetes: Secondary | ICD-10-CM | POA: Diagnosis not present

## 2015-12-22 DIAGNOSIS — I25118 Atherosclerotic heart disease of native coronary artery with other forms of angina pectoris: Secondary | ICD-10-CM | POA: Diagnosis not present

## 2015-12-22 DIAGNOSIS — Z79899 Other long term (current) drug therapy: Secondary | ICD-10-CM | POA: Diagnosis not present

## 2015-12-22 DIAGNOSIS — I25119 Atherosclerotic heart disease of native coronary artery with unspecified angina pectoris: Secondary | ICD-10-CM | POA: Diagnosis not present

## 2015-12-23 DIAGNOSIS — R7303 Prediabetes: Secondary | ICD-10-CM | POA: Diagnosis not present

## 2015-12-23 DIAGNOSIS — E785 Hyperlipidemia, unspecified: Secondary | ICD-10-CM | POA: Diagnosis not present

## 2015-12-23 DIAGNOSIS — I200 Unstable angina: Secondary | ICD-10-CM | POA: Diagnosis not present

## 2015-12-23 DIAGNOSIS — Z87891 Personal history of nicotine dependence: Secondary | ICD-10-CM | POA: Diagnosis not present

## 2015-12-23 DIAGNOSIS — I25119 Atherosclerotic heart disease of native coronary artery with unspecified angina pectoris: Secondary | ICD-10-CM | POA: Diagnosis not present

## 2015-12-23 DIAGNOSIS — I10 Essential (primary) hypertension: Secondary | ICD-10-CM | POA: Diagnosis not present

## 2016-01-08 ENCOUNTER — Other Ambulatory Visit: Payer: Self-pay

## 2016-01-14 DIAGNOSIS — I739 Peripheral vascular disease, unspecified: Secondary | ICD-10-CM | POA: Diagnosis not present

## 2016-01-14 DIAGNOSIS — Z1389 Encounter for screening for other disorder: Secondary | ICD-10-CM | POA: Diagnosis not present

## 2016-01-14 DIAGNOSIS — R739 Hyperglycemia, unspecified: Secondary | ICD-10-CM | POA: Diagnosis not present

## 2016-01-14 DIAGNOSIS — G4733 Obstructive sleep apnea (adult) (pediatric): Secondary | ICD-10-CM | POA: Diagnosis not present

## 2016-01-14 DIAGNOSIS — N400 Enlarged prostate without lower urinary tract symptoms: Secondary | ICD-10-CM | POA: Diagnosis not present

## 2016-01-14 DIAGNOSIS — R7303 Prediabetes: Secondary | ICD-10-CM | POA: Diagnosis not present

## 2016-01-14 DIAGNOSIS — I10 Essential (primary) hypertension: Secondary | ICD-10-CM | POA: Diagnosis not present

## 2016-01-14 DIAGNOSIS — E7800 Pure hypercholesterolemia, unspecified: Secondary | ICD-10-CM | POA: Diagnosis not present

## 2016-01-14 DIAGNOSIS — D751 Secondary polycythemia: Secondary | ICD-10-CM | POA: Diagnosis not present

## 2016-01-14 DIAGNOSIS — D696 Thrombocytopenia, unspecified: Secondary | ICD-10-CM | POA: Diagnosis not present

## 2016-01-14 DIAGNOSIS — I2510 Atherosclerotic heart disease of native coronary artery without angina pectoris: Secondary | ICD-10-CM | POA: Diagnosis not present

## 2016-01-14 DIAGNOSIS — Z23 Encounter for immunization: Secondary | ICD-10-CM | POA: Diagnosis not present

## 2016-01-14 DIAGNOSIS — M150 Primary generalized (osteo)arthritis: Secondary | ICD-10-CM | POA: Diagnosis not present

## 2016-01-14 DIAGNOSIS — E291 Testicular hypofunction: Secondary | ICD-10-CM | POA: Diagnosis not present

## 2016-01-19 DIAGNOSIS — H903 Sensorineural hearing loss, bilateral: Secondary | ICD-10-CM | POA: Diagnosis not present

## 2016-01-19 DIAGNOSIS — H6981 Other specified disorders of Eustachian tube, right ear: Secondary | ICD-10-CM | POA: Diagnosis not present

## 2016-01-21 DIAGNOSIS — L299 Pruritus, unspecified: Secondary | ICD-10-CM | POA: Diagnosis not present

## 2016-01-21 DIAGNOSIS — H6981 Other specified disorders of Eustachian tube, right ear: Secondary | ICD-10-CM | POA: Diagnosis not present

## 2016-01-21 DIAGNOSIS — L300 Nummular dermatitis: Secondary | ICD-10-CM | POA: Diagnosis not present

## 2016-02-03 DIAGNOSIS — L299 Pruritus, unspecified: Secondary | ICD-10-CM | POA: Diagnosis not present

## 2016-02-03 DIAGNOSIS — L300 Nummular dermatitis: Secondary | ICD-10-CM | POA: Diagnosis not present

## 2016-02-12 DIAGNOSIS — R509 Fever, unspecified: Secondary | ICD-10-CM | POA: Diagnosis not present

## 2016-02-12 DIAGNOSIS — J209 Acute bronchitis, unspecified: Secondary | ICD-10-CM | POA: Diagnosis not present

## 2016-02-12 DIAGNOSIS — R05 Cough: Secondary | ICD-10-CM | POA: Diagnosis not present

## 2016-02-12 DIAGNOSIS — R109 Unspecified abdominal pain: Secondary | ICD-10-CM | POA: Diagnosis not present

## 2016-02-12 DIAGNOSIS — I10 Essential (primary) hypertension: Secondary | ICD-10-CM | POA: Diagnosis not present

## 2016-02-12 DIAGNOSIS — K760 Fatty (change of) liver, not elsewhere classified: Secondary | ICD-10-CM | POA: Diagnosis not present

## 2016-02-12 DIAGNOSIS — I739 Peripheral vascular disease, unspecified: Secondary | ICD-10-CM | POA: Diagnosis not present

## 2016-02-12 DIAGNOSIS — A419 Sepsis, unspecified organism: Secondary | ICD-10-CM | POA: Diagnosis not present

## 2016-02-12 DIAGNOSIS — N179 Acute kidney failure, unspecified: Secondary | ICD-10-CM | POA: Diagnosis not present

## 2016-02-12 DIAGNOSIS — K8020 Calculus of gallbladder without cholecystitis without obstruction: Secondary | ICD-10-CM | POA: Diagnosis not present

## 2016-02-12 DIAGNOSIS — E872 Acidosis: Secondary | ICD-10-CM | POA: Diagnosis not present

## 2016-02-12 DIAGNOSIS — I2510 Atherosclerotic heart disease of native coronary artery without angina pectoris: Secondary | ICD-10-CM | POA: Diagnosis not present

## 2016-02-12 DIAGNOSIS — D696 Thrombocytopenia, unspecified: Secondary | ICD-10-CM | POA: Diagnosis not present

## 2016-02-13 DIAGNOSIS — A419 Sepsis, unspecified organism: Secondary | ICD-10-CM | POA: Diagnosis present

## 2016-02-13 DIAGNOSIS — G4733 Obstructive sleep apnea (adult) (pediatric): Secondary | ICD-10-CM | POA: Diagnosis present

## 2016-02-13 DIAGNOSIS — Z7902 Long term (current) use of antithrombotics/antiplatelets: Secondary | ICD-10-CM | POA: Diagnosis not present

## 2016-02-13 DIAGNOSIS — Z955 Presence of coronary angioplasty implant and graft: Secondary | ICD-10-CM | POA: Diagnosis not present

## 2016-02-13 DIAGNOSIS — K760 Fatty (change of) liver, not elsewhere classified: Secondary | ICD-10-CM | POA: Diagnosis present

## 2016-02-13 DIAGNOSIS — Z79899 Other long term (current) drug therapy: Secondary | ICD-10-CM | POA: Diagnosis not present

## 2016-02-13 DIAGNOSIS — E872 Acidosis: Secondary | ICD-10-CM | POA: Diagnosis present

## 2016-02-13 DIAGNOSIS — K8060 Calculus of gallbladder and bile duct with cholecystitis, unspecified, without obstruction: Secondary | ICD-10-CM | POA: Diagnosis not present

## 2016-02-13 DIAGNOSIS — R05 Cough: Secondary | ICD-10-CM | POA: Diagnosis not present

## 2016-02-13 DIAGNOSIS — R7303 Prediabetes: Secondary | ICD-10-CM | POA: Diagnosis present

## 2016-02-13 DIAGNOSIS — Z87891 Personal history of nicotine dependence: Secondary | ICD-10-CM | POA: Diagnosis not present

## 2016-02-13 DIAGNOSIS — Z8249 Family history of ischemic heart disease and other diseases of the circulatory system: Secondary | ICD-10-CM | POA: Diagnosis not present

## 2016-02-13 DIAGNOSIS — Z7982 Long term (current) use of aspirin: Secondary | ICD-10-CM | POA: Diagnosis not present

## 2016-02-13 DIAGNOSIS — Z6834 Body mass index (BMI) 34.0-34.9, adult: Secondary | ICD-10-CM | POA: Diagnosis not present

## 2016-02-13 DIAGNOSIS — I10 Essential (primary) hypertension: Secondary | ICD-10-CM | POA: Diagnosis present

## 2016-02-13 DIAGNOSIS — D696 Thrombocytopenia, unspecified: Secondary | ICD-10-CM | POA: Diagnosis present

## 2016-02-13 DIAGNOSIS — K8020 Calculus of gallbladder without cholecystitis without obstruction: Secondary | ICD-10-CM | POA: Diagnosis present

## 2016-02-13 DIAGNOSIS — J209 Acute bronchitis, unspecified: Secondary | ICD-10-CM | POA: Diagnosis present

## 2016-02-13 DIAGNOSIS — I2510 Atherosclerotic heart disease of native coronary artery without angina pectoris: Secondary | ICD-10-CM | POA: Diagnosis present

## 2016-02-13 DIAGNOSIS — E785 Hyperlipidemia, unspecified: Secondary | ICD-10-CM | POA: Diagnosis present

## 2016-02-15 DIAGNOSIS — R1013 Epigastric pain: Secondary | ICD-10-CM | POA: Diagnosis not present

## 2016-02-15 DIAGNOSIS — K8010 Calculus of gallbladder with chronic cholecystitis without obstruction: Secondary | ICD-10-CM | POA: Diagnosis not present

## 2016-02-15 DIAGNOSIS — I10 Essential (primary) hypertension: Secondary | ICD-10-CM | POA: Diagnosis not present

## 2016-02-15 DIAGNOSIS — R1011 Right upper quadrant pain: Secondary | ICD-10-CM | POA: Diagnosis not present

## 2016-02-16 DIAGNOSIS — H903 Sensorineural hearing loss, bilateral: Secondary | ICD-10-CM | POA: Diagnosis not present

## 2016-02-16 DIAGNOSIS — H6981 Other specified disorders of Eustachian tube, right ear: Secondary | ICD-10-CM | POA: Diagnosis not present

## 2016-02-16 DIAGNOSIS — Z9622 Myringotomy tube(s) status: Secondary | ICD-10-CM | POA: Diagnosis not present

## 2016-02-17 DIAGNOSIS — K8010 Calculus of gallbladder with chronic cholecystitis without obstruction: Secondary | ICD-10-CM | POA: Diagnosis not present

## 2016-02-17 DIAGNOSIS — K8012 Calculus of gallbladder with acute and chronic cholecystitis without obstruction: Secondary | ICD-10-CM | POA: Diagnosis not present

## 2016-02-17 DIAGNOSIS — K7581 Nonalcoholic steatohepatitis (NASH): Secondary | ICD-10-CM | POA: Diagnosis not present

## 2016-02-17 DIAGNOSIS — I2510 Atherosclerotic heart disease of native coronary artery without angina pectoris: Secondary | ICD-10-CM | POA: Diagnosis not present

## 2016-02-17 DIAGNOSIS — K759 Inflammatory liver disease, unspecified: Secondary | ICD-10-CM | POA: Diagnosis not present

## 2016-02-17 DIAGNOSIS — E785 Hyperlipidemia, unspecified: Secondary | ICD-10-CM | POA: Diagnosis not present

## 2016-02-17 DIAGNOSIS — Z951 Presence of aortocoronary bypass graft: Secondary | ICD-10-CM | POA: Diagnosis not present

## 2016-02-17 DIAGNOSIS — G4730 Sleep apnea, unspecified: Secondary | ICD-10-CM | POA: Diagnosis not present

## 2016-02-17 DIAGNOSIS — Z955 Presence of coronary angioplasty implant and graft: Secondary | ICD-10-CM | POA: Diagnosis not present

## 2016-02-17 DIAGNOSIS — K8020 Calculus of gallbladder without cholecystitis without obstruction: Secondary | ICD-10-CM | POA: Diagnosis not present

## 2016-02-17 DIAGNOSIS — I10 Essential (primary) hypertension: Secondary | ICD-10-CM | POA: Diagnosis not present

## 2016-02-18 DIAGNOSIS — Z955 Presence of coronary angioplasty implant and graft: Secondary | ICD-10-CM | POA: Diagnosis not present

## 2016-02-18 DIAGNOSIS — G4730 Sleep apnea, unspecified: Secondary | ICD-10-CM | POA: Diagnosis not present

## 2016-02-18 DIAGNOSIS — I10 Essential (primary) hypertension: Secondary | ICD-10-CM | POA: Diagnosis not present

## 2016-02-18 DIAGNOSIS — K8012 Calculus of gallbladder with acute and chronic cholecystitis without obstruction: Secondary | ICD-10-CM | POA: Diagnosis not present

## 2016-02-18 DIAGNOSIS — E785 Hyperlipidemia, unspecified: Secondary | ICD-10-CM | POA: Diagnosis not present

## 2016-02-18 DIAGNOSIS — I2510 Atherosclerotic heart disease of native coronary artery without angina pectoris: Secondary | ICD-10-CM | POA: Diagnosis not present

## 2016-02-24 DIAGNOSIS — R05 Cough: Secondary | ICD-10-CM | POA: Diagnosis not present

## 2016-02-24 DIAGNOSIS — R509 Fever, unspecified: Secondary | ICD-10-CM | POA: Diagnosis not present

## 2016-02-24 DIAGNOSIS — R197 Diarrhea, unspecified: Secondary | ICD-10-CM | POA: Diagnosis not present

## 2016-02-26 DIAGNOSIS — R509 Fever, unspecified: Secondary | ICD-10-CM | POA: Diagnosis not present

## 2016-03-01 DIAGNOSIS — E785 Hyperlipidemia, unspecified: Secondary | ICD-10-CM | POA: Diagnosis not present

## 2016-03-01 DIAGNOSIS — I2510 Atherosclerotic heart disease of native coronary artery without angina pectoris: Secondary | ICD-10-CM | POA: Diagnosis not present

## 2016-03-01 DIAGNOSIS — I10 Essential (primary) hypertension: Secondary | ICD-10-CM | POA: Diagnosis not present

## 2016-04-14 DIAGNOSIS — L300 Nummular dermatitis: Secondary | ICD-10-CM | POA: Diagnosis not present

## 2016-04-14 DIAGNOSIS — L299 Pruritus, unspecified: Secondary | ICD-10-CM | POA: Diagnosis not present

## 2016-04-27 DIAGNOSIS — L299 Pruritus, unspecified: Secondary | ICD-10-CM | POA: Diagnosis not present

## 2016-04-27 DIAGNOSIS — L300 Nummular dermatitis: Secondary | ICD-10-CM | POA: Diagnosis not present

## 2016-04-27 DIAGNOSIS — E785 Hyperlipidemia, unspecified: Secondary | ICD-10-CM | POA: Diagnosis not present

## 2016-04-27 DIAGNOSIS — R531 Weakness: Secondary | ICD-10-CM | POA: Diagnosis not present

## 2016-05-03 DIAGNOSIS — L300 Nummular dermatitis: Secondary | ICD-10-CM | POA: Diagnosis not present

## 2016-05-06 ENCOUNTER — Encounter (HOSPITAL_COMMUNITY): Payer: Self-pay | Admitting: Cardiovascular Disease

## 2016-05-30 ENCOUNTER — Other Ambulatory Visit: Payer: Self-pay | Admitting: Cardiovascular Disease

## 2016-05-30 DIAGNOSIS — I739 Peripheral vascular disease, unspecified: Secondary | ICD-10-CM

## 2016-06-13 DIAGNOSIS — R739 Hyperglycemia, unspecified: Secondary | ICD-10-CM | POA: Diagnosis not present

## 2016-06-13 DIAGNOSIS — I10 Essential (primary) hypertension: Secondary | ICD-10-CM | POA: Diagnosis not present

## 2016-06-13 DIAGNOSIS — E7800 Pure hypercholesterolemia, unspecified: Secondary | ICD-10-CM | POA: Diagnosis not present

## 2016-06-14 DIAGNOSIS — R531 Weakness: Secondary | ICD-10-CM | POA: Diagnosis not present

## 2016-06-14 DIAGNOSIS — L300 Nummular dermatitis: Secondary | ICD-10-CM | POA: Diagnosis not present

## 2016-06-14 DIAGNOSIS — L570 Actinic keratosis: Secondary | ICD-10-CM | POA: Diagnosis not present

## 2016-06-15 DIAGNOSIS — H6981 Other specified disorders of Eustachian tube, right ear: Secondary | ICD-10-CM | POA: Diagnosis not present

## 2016-06-15 DIAGNOSIS — H903 Sensorineural hearing loss, bilateral: Secondary | ICD-10-CM | POA: Diagnosis not present

## 2016-06-15 DIAGNOSIS — Z9622 Myringotomy tube(s) status: Secondary | ICD-10-CM | POA: Diagnosis not present

## 2016-06-15 DIAGNOSIS — H9211 Otorrhea, right ear: Secondary | ICD-10-CM | POA: Diagnosis not present

## 2016-06-16 ENCOUNTER — Ambulatory Visit (HOSPITAL_COMMUNITY)
Admission: RE | Admit: 2016-06-16 | Discharge: 2016-06-16 | Disposition: A | Discharge status: Home / Self Care | Payer: Medicare Other | Source: Ambulatory Visit | Attending: Cardiovascular Disease | Admitting: Cardiovascular Disease

## 2016-06-16 DIAGNOSIS — I739 Peripheral vascular disease, unspecified: Secondary | ICD-10-CM

## 2016-06-16 DIAGNOSIS — Z95828 Presence of other vascular implants and grafts: Secondary | ICD-10-CM | POA: Insufficient documentation

## 2016-06-16 DIAGNOSIS — E785 Hyperlipidemia, unspecified: Secondary | ICD-10-CM | POA: Diagnosis not present

## 2016-06-16 DIAGNOSIS — I10 Essential (primary) hypertension: Secondary | ICD-10-CM | POA: Insufficient documentation

## 2016-06-16 DIAGNOSIS — I70202 Unspecified atherosclerosis of native arteries of extremities, left leg: Secondary | ICD-10-CM | POA: Insufficient documentation

## 2016-06-16 DIAGNOSIS — I2510 Atherosclerotic heart disease of native coronary artery without angina pectoris: Secondary | ICD-10-CM | POA: Diagnosis not present

## 2016-06-16 DIAGNOSIS — Z87891 Personal history of nicotine dependence: Secondary | ICD-10-CM | POA: Diagnosis not present

## 2016-06-22 ENCOUNTER — Other Ambulatory Visit: Payer: Self-pay | Admitting: Cardiovascular Disease

## 2016-06-22 DIAGNOSIS — E785 Hyperlipidemia, unspecified: Secondary | ICD-10-CM | POA: Diagnosis not present

## 2016-06-22 DIAGNOSIS — I739 Peripheral vascular disease, unspecified: Secondary | ICD-10-CM

## 2016-06-22 DIAGNOSIS — I10 Essential (primary) hypertension: Secondary | ICD-10-CM | POA: Diagnosis not present

## 2016-06-22 DIAGNOSIS — I2510 Atherosclerotic heart disease of native coronary artery without angina pectoris: Secondary | ICD-10-CM | POA: Diagnosis not present

## 2016-06-22 DIAGNOSIS — Z6837 Body mass index (BMI) 37.0-37.9, adult: Secondary | ICD-10-CM | POA: Diagnosis not present

## 2016-06-24 DIAGNOSIS — I2510 Atherosclerotic heart disease of native coronary artery without angina pectoris: Secondary | ICD-10-CM | POA: Diagnosis not present

## 2016-08-08 DIAGNOSIS — N3020 Other chronic cystitis without hematuria: Secondary | ICD-10-CM | POA: Diagnosis not present

## 2016-08-08 DIAGNOSIS — N401 Enlarged prostate with lower urinary tract symptoms: Secondary | ICD-10-CM | POA: Diagnosis not present

## 2016-08-08 DIAGNOSIS — R351 Nocturia: Secondary | ICD-10-CM | POA: Diagnosis not present

## 2016-09-06 DIAGNOSIS — L570 Actinic keratosis: Secondary | ICD-10-CM | POA: Diagnosis not present

## 2016-09-06 DIAGNOSIS — R531 Weakness: Secondary | ICD-10-CM | POA: Diagnosis not present

## 2016-09-06 DIAGNOSIS — L300 Nummular dermatitis: Secondary | ICD-10-CM | POA: Diagnosis not present

## 2016-09-06 DIAGNOSIS — L299 Pruritus, unspecified: Secondary | ICD-10-CM | POA: Diagnosis not present

## 2016-09-12 DIAGNOSIS — E7800 Pure hypercholesterolemia, unspecified: Secondary | ICD-10-CM | POA: Diagnosis not present

## 2016-09-12 DIAGNOSIS — R739 Hyperglycemia, unspecified: Secondary | ICD-10-CM | POA: Diagnosis not present

## 2016-09-12 DIAGNOSIS — I2510 Atherosclerotic heart disease of native coronary artery without angina pectoris: Secondary | ICD-10-CM | POA: Diagnosis not present

## 2016-10-18 DIAGNOSIS — E7800 Pure hypercholesterolemia, unspecified: Secondary | ICD-10-CM | POA: Diagnosis not present

## 2016-11-04 DIAGNOSIS — S83241A Other tear of medial meniscus, current injury, right knee, initial encounter: Secondary | ICD-10-CM | POA: Diagnosis not present

## 2016-11-04 DIAGNOSIS — W010XXA Fall on same level from slipping, tripping and stumbling without subsequent striking against object, initial encounter: Secondary | ICD-10-CM | POA: Diagnosis not present

## 2016-11-04 DIAGNOSIS — M25561 Pain in right knee: Secondary | ICD-10-CM | POA: Diagnosis not present

## 2016-11-15 DIAGNOSIS — S83241A Other tear of medial meniscus, current injury, right knee, initial encounter: Secondary | ICD-10-CM | POA: Diagnosis not present

## 2016-11-15 DIAGNOSIS — R600 Localized edema: Secondary | ICD-10-CM | POA: Diagnosis not present

## 2016-11-15 DIAGNOSIS — M25461 Effusion, right knee: Secondary | ICD-10-CM | POA: Diagnosis not present

## 2016-11-17 DIAGNOSIS — Z6833 Body mass index (BMI) 33.0-33.9, adult: Secondary | ICD-10-CM | POA: Diagnosis not present

## 2016-11-17 DIAGNOSIS — M23303 Other meniscus derangements, unspecified medial meniscus, right knee: Secondary | ICD-10-CM | POA: Diagnosis not present

## 2016-11-21 ENCOUNTER — Telehealth: Payer: Self-pay | Admitting: Cardiology

## 2016-11-21 NOTE — Telephone Encounter (Signed)
One week prior to surgery

## 2016-11-21 NOTE — Telephone Encounter (Signed)
Please advise. Notes in Epic.

## 2016-11-21 NOTE — Telephone Encounter (Signed)
Is having surgery on Friday for his meniscus and wants to know how many days before he needs to stop his plavix

## 2016-11-22 NOTE — Telephone Encounter (Signed)
Left message to return call 

## 2016-11-23 NOTE — Telephone Encounter (Signed)
Patient advised to stop Plavix 7 days prior to surgery.

## 2016-12-07 DIAGNOSIS — M23203 Derangement of unspecified medial meniscus due to old tear or injury, right knee: Secondary | ICD-10-CM | POA: Diagnosis not present

## 2016-12-07 DIAGNOSIS — M2241 Chondromalacia patellae, right knee: Secondary | ICD-10-CM | POA: Diagnosis not present

## 2016-12-07 DIAGNOSIS — S83241A Other tear of medial meniscus, current injury, right knee, initial encounter: Secondary | ICD-10-CM | POA: Diagnosis not present

## 2016-12-07 DIAGNOSIS — M23331 Other meniscus derangements, other medial meniscus, right knee: Secondary | ICD-10-CM | POA: Diagnosis not present

## 2016-12-09 DIAGNOSIS — I2510 Atherosclerotic heart disease of native coronary artery without angina pectoris: Secondary | ICD-10-CM | POA: Diagnosis not present

## 2016-12-09 DIAGNOSIS — E7800 Pure hypercholesterolemia, unspecified: Secondary | ICD-10-CM | POA: Diagnosis not present

## 2017-02-13 DIAGNOSIS — Z23 Encounter for immunization: Secondary | ICD-10-CM | POA: Diagnosis not present

## 2017-03-17 DIAGNOSIS — E7800 Pure hypercholesterolemia, unspecified: Secondary | ICD-10-CM | POA: Diagnosis not present

## 2017-03-17 DIAGNOSIS — Z1389 Encounter for screening for other disorder: Secondary | ICD-10-CM | POA: Diagnosis not present

## 2017-03-17 DIAGNOSIS — D751 Secondary polycythemia: Secondary | ICD-10-CM | POA: Diagnosis not present

## 2017-03-17 DIAGNOSIS — E291 Testicular hypofunction: Secondary | ICD-10-CM | POA: Diagnosis not present

## 2017-03-17 DIAGNOSIS — I2510 Atherosclerotic heart disease of native coronary artery without angina pectoris: Secondary | ICD-10-CM | POA: Diagnosis not present

## 2017-03-17 DIAGNOSIS — Z6834 Body mass index (BMI) 34.0-34.9, adult: Secondary | ICD-10-CM | POA: Diagnosis not present

## 2017-03-17 DIAGNOSIS — R7303 Prediabetes: Secondary | ICD-10-CM | POA: Diagnosis not present

## 2017-03-17 DIAGNOSIS — D696 Thrombocytopenia, unspecified: Secondary | ICD-10-CM | POA: Diagnosis not present

## 2017-03-17 DIAGNOSIS — M150 Primary generalized (osteo)arthritis: Secondary | ICD-10-CM | POA: Diagnosis not present

## 2017-03-17 DIAGNOSIS — N400 Enlarged prostate without lower urinary tract symptoms: Secondary | ICD-10-CM | POA: Diagnosis not present

## 2017-03-17 DIAGNOSIS — I739 Peripheral vascular disease, unspecified: Secondary | ICD-10-CM | POA: Diagnosis not present

## 2017-03-17 DIAGNOSIS — I10 Essential (primary) hypertension: Secondary | ICD-10-CM | POA: Diagnosis not present

## 2017-05-23 DIAGNOSIS — Z23 Encounter for immunization: Secondary | ICD-10-CM | POA: Diagnosis not present

## 2017-06-02 DIAGNOSIS — E785 Hyperlipidemia, unspecified: Secondary | ICD-10-CM | POA: Diagnosis not present

## 2017-06-02 DIAGNOSIS — R531 Weakness: Secondary | ICD-10-CM | POA: Diagnosis not present

## 2017-06-08 DIAGNOSIS — Z9622 Myringotomy tube(s) status: Secondary | ICD-10-CM | POA: Diagnosis not present

## 2017-06-08 DIAGNOSIS — H9221 Otorrhagia, right ear: Secondary | ICD-10-CM | POA: Diagnosis not present

## 2017-06-09 DIAGNOSIS — Z1211 Encounter for screening for malignant neoplasm of colon: Secondary | ICD-10-CM | POA: Diagnosis not present

## 2017-06-09 DIAGNOSIS — K635 Polyp of colon: Secondary | ICD-10-CM | POA: Diagnosis not present

## 2017-06-09 DIAGNOSIS — D126 Benign neoplasm of colon, unspecified: Secondary | ICD-10-CM | POA: Diagnosis not present

## 2017-06-09 DIAGNOSIS — D125 Benign neoplasm of sigmoid colon: Secondary | ICD-10-CM | POA: Diagnosis not present

## 2017-06-09 DIAGNOSIS — K5730 Diverticulosis of large intestine without perforation or abscess without bleeding: Secondary | ICD-10-CM | POA: Diagnosis not present

## 2017-06-09 DIAGNOSIS — Z86010 Personal history of colonic polyps: Secondary | ICD-10-CM | POA: Diagnosis not present

## 2017-06-09 DIAGNOSIS — D124 Benign neoplasm of descending colon: Secondary | ICD-10-CM | POA: Diagnosis not present

## 2017-06-09 DIAGNOSIS — D123 Benign neoplasm of transverse colon: Secondary | ICD-10-CM | POA: Diagnosis not present

## 2017-06-13 ENCOUNTER — Other Ambulatory Visit: Payer: Self-pay | Admitting: Cardiovascular Disease

## 2017-06-13 DIAGNOSIS — I739 Peripheral vascular disease, unspecified: Secondary | ICD-10-CM

## 2017-06-22 ENCOUNTER — Ambulatory Visit (HOSPITAL_COMMUNITY)
Admission: RE | Admit: 2017-06-22 | Discharge: 2017-06-22 | Disposition: A | Discharge status: Home / Self Care | Payer: Medicare Other | Source: Ambulatory Visit | Attending: Cardiovascular Disease | Admitting: Cardiovascular Disease

## 2017-06-22 DIAGNOSIS — E785 Hyperlipidemia, unspecified: Secondary | ICD-10-CM | POA: Insufficient documentation

## 2017-06-22 DIAGNOSIS — Z87891 Personal history of nicotine dependence: Secondary | ICD-10-CM | POA: Diagnosis not present

## 2017-06-22 DIAGNOSIS — I10 Essential (primary) hypertension: Secondary | ICD-10-CM | POA: Insufficient documentation

## 2017-06-22 DIAGNOSIS — I2510 Atherosclerotic heart disease of native coronary artery without angina pectoris: Secondary | ICD-10-CM | POA: Diagnosis not present

## 2017-06-22 DIAGNOSIS — I739 Peripheral vascular disease, unspecified: Secondary | ICD-10-CM | POA: Diagnosis not present

## 2017-06-26 ENCOUNTER — Other Ambulatory Visit: Payer: Self-pay | Admitting: Cardiovascular Disease

## 2017-06-26 DIAGNOSIS — I739 Peripheral vascular disease, unspecified: Secondary | ICD-10-CM

## 2017-06-30 DIAGNOSIS — H2703 Aphakia, bilateral: Secondary | ICD-10-CM | POA: Diagnosis not present

## 2017-07-04 DIAGNOSIS — I10 Essential (primary) hypertension: Secondary | ICD-10-CM

## 2017-07-04 DIAGNOSIS — M1990 Unspecified osteoarthritis, unspecified site: Secondary | ICD-10-CM

## 2017-07-04 HISTORY — DX: Essential (primary) hypertension: I10

## 2017-07-04 HISTORY — DX: Unspecified osteoarthritis, unspecified site: M19.90

## 2017-07-20 DIAGNOSIS — H0279 Other degenerative disorders of eyelid and periocular area: Secondary | ICD-10-CM | POA: Diagnosis not present

## 2017-07-20 DIAGNOSIS — H02413 Mechanical ptosis of bilateral eyelids: Secondary | ICD-10-CM | POA: Diagnosis not present

## 2017-07-20 DIAGNOSIS — H53489 Generalized contraction of visual field, unspecified eye: Secondary | ICD-10-CM | POA: Diagnosis not present

## 2017-07-20 DIAGNOSIS — H02831 Dermatochalasis of right upper eyelid: Secondary | ICD-10-CM | POA: Diagnosis not present

## 2017-07-20 DIAGNOSIS — H02423 Myogenic ptosis of bilateral eyelids: Secondary | ICD-10-CM | POA: Diagnosis not present

## 2017-07-20 DIAGNOSIS — H02834 Dermatochalasis of left upper eyelid: Secondary | ICD-10-CM | POA: Diagnosis not present

## 2017-09-21 DIAGNOSIS — H6981 Other specified disorders of Eustachian tube, right ear: Secondary | ICD-10-CM | POA: Diagnosis not present

## 2017-09-21 DIAGNOSIS — Z9622 Myringotomy tube(s) status: Secondary | ICD-10-CM | POA: Diagnosis not present

## 2017-09-21 DIAGNOSIS — H9221 Otorrhagia, right ear: Secondary | ICD-10-CM | POA: Diagnosis not present

## 2017-10-17 DIAGNOSIS — H02413 Mechanical ptosis of bilateral eyelids: Secondary | ICD-10-CM | POA: Diagnosis not present

## 2017-10-17 DIAGNOSIS — H57813 Brow ptosis, bilateral: Secondary | ICD-10-CM | POA: Diagnosis not present

## 2017-10-17 DIAGNOSIS — G8918 Other acute postprocedural pain: Secondary | ICD-10-CM | POA: Diagnosis not present

## 2017-10-17 DIAGNOSIS — H02132 Senile ectropion of right lower eyelid: Secondary | ICD-10-CM | POA: Diagnosis not present

## 2017-10-17 DIAGNOSIS — H02423 Myogenic ptosis of bilateral eyelids: Secondary | ICD-10-CM | POA: Diagnosis not present

## 2017-10-17 DIAGNOSIS — H02135 Senile ectropion of left lower eyelid: Secondary | ICD-10-CM | POA: Diagnosis not present

## 2017-10-26 DIAGNOSIS — R739 Hyperglycemia, unspecified: Secondary | ICD-10-CM | POA: Diagnosis not present

## 2017-10-26 DIAGNOSIS — R7303 Prediabetes: Secondary | ICD-10-CM | POA: Diagnosis not present

## 2017-10-26 DIAGNOSIS — E7800 Pure hypercholesterolemia, unspecified: Secondary | ICD-10-CM | POA: Diagnosis not present

## 2017-10-26 DIAGNOSIS — I10 Essential (primary) hypertension: Secondary | ICD-10-CM | POA: Diagnosis not present

## 2017-12-12 DIAGNOSIS — Z87891 Personal history of nicotine dependence: Secondary | ICD-10-CM | POA: Diagnosis not present

## 2017-12-12 DIAGNOSIS — H9211 Otorrhea, right ear: Secondary | ICD-10-CM | POA: Diagnosis not present

## 2017-12-12 DIAGNOSIS — Z9622 Myringotomy tube(s) status: Secondary | ICD-10-CM | POA: Diagnosis not present

## 2018-01-08 ENCOUNTER — Telehealth: Payer: Self-pay | Admitting: Cardiology

## 2018-01-08 NOTE — Telephone Encounter (Signed)
Patient scheduled.

## 2018-01-08 NOTE — Telephone Encounter (Signed)
Patient requesting to be seen next week to establish care and get an updated ekg. Patient has been staying out of town in [place] and has been having a hard time to get down here to appointments. Will consult with Dr. Bettina Gavia.

## 2018-01-08 NOTE — Telephone Encounter (Signed)
Wants to come in for an EKG

## 2018-01-16 NOTE — Progress Notes (Signed)
Cardiology Office Note:    Date:  01/17/2018   ID:  Daniel Adkins, DOB June 12, 1945, MRN [id number]  PCP:  Townsend Roger, MD  Cardiologist:  Shirlee More, MD    Referring MD: Townsend Roger, MD    ASSESSMENT:    1. Coronary artery disease involving native coronary artery of native heart with angina pectoris ([place])   2. Essential hypertension   3. Pure hypercholesterolemia   4. Peripheral vascular disease with claudication ([place])    PLAN:    In order of problems listed above:  1. He has symptoms of change these develop new exercise intolerance and has had several episodes of postprandial chest discomfort relieved with nitroglycerin on occasion.  I advised him to undergo an ischemia evaluation stress myocardial perfusion study be performed and if he has evidence of ischemia or high risk markers would benefit from further revascularization.  In the interim we will continue his current medical treatment with long-term dual antiplatelet therapy lipid-lowering therapy of PCSK9.  Recent labs reviewed and okay p.m. from November and he has lab work scheduled for next month with his PCP copy to me.  He is statin intolerant. 2. Stable blood pressure target continue treatment ACE inhibitor 3. Stable continue PC SK 9 4. Stable   Next appointment: 6 months   Medication Adjustments/Labs and Tests Ordered: Current medicines are reviewed at length with the patient today.  Concerns regarding medicines are outlined above.  Orders Placed This Encounter  Procedures  . EKG 12-Lead   No orders of the defined types were placed in this encounter.   Chief Complaint  Patient presents with  . Coronary Artery Disease  . Hyperlipidemia  . Hypertension    History of Present Illness:    Daniel Adkins is a 72 y.o. male with a hx of CAD with PCI of Om1 Oct 2014 and PCI  And PCI of [facility] and Om1 June 2015   Hypertension hyperlipidemia and PAD last seen by me 06/22/16 at [facility].Most recently he  had PCI and stent  Of LAD 12/22/15. Compliance with diet, lifestyle and medications: Yes  This summer he had a hunting trip to [place] and is never quite been the same afterwards he has difficulty doing his physical activity with exercise tolerance and fatigue no claudication or chest pain and is unable to increase his walking back to water once he has also had several episodes of postprandial chest discomfort relieved on one occasion with nitroglycerin.  No shortness of breath palpitations syncope or TIA Past Medical History:  Diagnosis Date  . CAD (coronary artery disease)    cath 2002- med treatment  . Difficult intubation    "I have a short neck" (10/30/2012)  . Gout   . Hyperlipidemia   . Hypertension   . OSA on CPAP   . PAD (peripheral artery disease) ([place])     Past Surgical History:  Procedure Laterality Date  . ATHERECTOMY N/A 10/30/2012   Procedure: ATHERECTOMY;  Surgeon: Lorretta Harp, MD;  Location: [facility] CATH LAB;  Service: Cardiovascular;  Laterality: N/A;  . CARDIAC CATHETERIZATION  2002   medical treatment (done at [facility])  . CATARACT EXTRACTION    . CHOLECYSTECTOMY    . CORONARY ANGIOPLASTY WITH STENT PLACEMENT  10/30/2012   "1" (10/30/2012)  . KNEE ARTHROSCOPY W/ MENISCAL REPAIR Left ~ 2008  . Stent to Femoral Artery    . TONSILLECTOMY  1950's   "as a kid" (10/30/2012)    Current Medications:  Current Meds  Medication Sig  . aspirin EC 81 MG tablet Take 81 mg by mouth daily.  . clopidogrel (PLAVIX) 75 MG tablet Take 1 tablet (75 mg total) by mouth daily with breakfast.  . esomeprazole (NEXIUM) 40 MG capsule Take 1 capsule by mouth daily.  . Evolocumab (REPATHA SURECLICK) 921 MG/ML SOAJ Inject 560 mg into the skin every 30 (thirty) days. 140 mg 3 times per month  . folic acid (FOLVITE) 1 MG tablet Take 1 tablet by mouth daily.  Marland Kitchen lisinopril (PRINIVIL,ZESTRIL) 20 MG tablet Take 20 mg by mouth daily.  . Methotrexate, PF, 25 MG/0.5ML SOAJ Inject into the  skin once a week.  . Omega-3 Fatty Acids (FISH OIL) 1200 MG CAPS Take 1 capsule by mouth daily.     Allergies:   Cephalosporins; Aspirin; Rosuvastatin; and Sulfa antibiotics   Social History   Socioeconomic History  . Marital status: Single    Spouse name: Not on file  . Number of children: Not on file  . Years of education: Not on file  . Highest education level: Not on file  Occupational History  . Not on file  Social Needs  . Financial resource strain: Not on file  . Food insecurity:    Worry: Not on file    Inability: Not on file  . Transportation needs:    Medical: Not on file    Non-medical: Not on file  Tobacco Use  . Smoking status: Former Smoker    Years: 37.00    Types: Cigars    Last attempt to quit: 10/19/1995    Years since quitting: 22.2  . Smokeless tobacco: Never Used  . Tobacco comment: 10/30/2012 "last smoked 5 swisher sweets/day when I quit smoking"  Substance and Sexual Activity  . Alcohol use: Yes    Comment: 10/30/2012 "mixed drink usually once/month; sometimes twice"  . Drug use: No  . Sexual activity: Yes  Lifestyle  . Physical activity:    Days per week: Not on file    Minutes per session: Not on file  . Stress: Not on file  Relationships  . Social connections:    Talks on phone: Not on file    Gets together: Not on file    Attends religious service: Not on file    Active member of club or organization: Not on file    Attends meetings of clubs or organizations: Not on file    Relationship status: Not on file  Other Topics Concern  . Not on file  Social History Narrative  . Not on file     Family History: The patient's family history includes Cancer in his sister; Coronary artery disease in his father; Heart attack in his father. ROS:   Please see the history of present illness.    All other systems reviewed and are negative.  EKGs/Labs/Other Studies Reviewed:    The following studies were reviewed today:  EKG:  EKG ordered today.   The ekg ordered today demonstrates [facility] and normal  Recent Labs: No results found for requested labs within last 8760 hours.  Recent Lipid Panel No results found for: CHOL, TRIG, HDL, CHOLHDL, VLDL, LDLCALC, LDLDIRECT  Physical Exam:    VS:  BP 104/62 (BP Location: Right Arm, Patient Position: Sitting, Cuff Size: Large)   Pulse 65   Ht 5\' 10"  (1.778 m)   Wt 234 lb 6.4 oz (106.3 kg)   SpO2 96%   BMI 33.63 kg/m     Wt Readings from  Last 3 Encounters:  01/17/18 234 lb 6.4 oz (106.3 kg)  12/02/15 245 lb (111.1 kg)  11/28/14 249 lb (112.9 kg)     GEN:  Well nourished, well developed in no acute distress HEENT: Normal NECK: No JVD; No carotid bruits LYMPHATICS: No lymphadenopathy CARDIAC: RRR, no murmurs, rubs, gallops RESPIRATORY:  Clear to auscultation without rales, wheezing or rhonchi  ABDOMEN: Soft, non-tender, non-distended MUSCULOSKELETAL:  No edema; No deformity  SKIN: Warm and dry NEUROLOGIC:  Alert and oriented x 3 PSYCHIATRIC:  Normal affect    Signed, Shirlee More, MD  01/17/2018 12:00 PM    [place]

## 2018-01-17 ENCOUNTER — Ambulatory Visit (INDEPENDENT_AMBULATORY_CARE_PROVIDER_SITE_OTHER): Payer: Medicare Other | Admitting: Cardiology

## 2018-01-17 ENCOUNTER — Encounter: Payer: Self-pay | Admitting: Cardiology

## 2018-01-17 VITALS — BP 104/62 | HR 65 | Ht 70.0 in | Wt 234.4 lb

## 2018-01-17 DIAGNOSIS — I25119 Atherosclerotic heart disease of native coronary artery with unspecified angina pectoris: Secondary | ICD-10-CM | POA: Diagnosis not present

## 2018-01-17 DIAGNOSIS — I739 Peripheral vascular disease, unspecified: Secondary | ICD-10-CM | POA: Diagnosis not present

## 2018-01-17 DIAGNOSIS — I10 Essential (primary) hypertension: Secondary | ICD-10-CM

## 2018-01-17 DIAGNOSIS — E7800 Pure hypercholesterolemia, unspecified: Secondary | ICD-10-CM

## 2018-01-17 NOTE — Patient Instructions (Signed)
Medication Instructions:  Your physician recommends that you continue on your current medications as directed. Please refer to the Current Medication list given to you today.   Labwork: None.  Testing/Procedures: Your physician has requested that you have a lexiscan myoview. For further information please visit [email or web address]. Please follow instruction sheet, as given.    Follow-Up: Your physician wants you to follow-up in: 6 months. You will receive a reminder letter in the mail two months in advance. If you don't receive a letter, please call our office to schedule the follow-up appointment.   Any Other Special Instructions Will Be Listed Below (If Applicable).     If you need a refill on your cardiac medications before your next appointment, please call your pharmacy.  Cardiac Nuclear Scan A cardiac nuclear scan is a test that measures blood flow to the heart when a person is resting and when he or she is exercising. The test looks for problems such as:  Not enough blood reaching a portion of the heart.  The heart muscle not working normally.  You may need this test if:  You have heart disease.  You have had abnormal lab results.  You have had heart surgery or angioplasty.  You have chest pain.  You have shortness of breath.  In this test, a radioactive dye (tracer) is injected into your bloodstream. After the tracer has traveled to your heart, an imaging device is used to measure how much of the tracer is absorbed by or distributed to various areas of your heart. This procedure is usually done at a hospital and takes 2-4 hours. Tell a health care provider about:  Any allergies you have.  All medicines you are taking, including vitamins, herbs, eye drops, creams, and over-the-counter medicines.  Any problems you or family members have had with the use of anesthetic medicines.  Any blood disorders you have.  Any surgeries you have had.  Any medical  conditions you have.  Whether you are pregnant or may be pregnant. What are the risks? Generally, this is a safe procedure. However, problems may occur, including:  Serious chest pain and heart attack. This is only a risk if the stress portion of the test is done.  Rapid heartbeat.  Sensation of warmth in your chest. This usually passes quickly.  What happens before the procedure?  Ask your health care provider about changing or stopping your regular medicines. This is especially important if you are taking diabetes medicines or blood thinners.  Remove your jewelry on the day of the procedure. What happens during the procedure?  An IV tube will be inserted into one of your veins.  Your health care provider will inject a small amount of radioactive tracer through the tube.  You will wait for 20-40 minutes while the tracer travels through your bloodstream.  Your heart activity will be monitored with an electrocardiogram (ECG).  You will lie down on an exam table.  Images of your heart will be taken for about 15-20 minutes.  You may be asked to exercise on a treadmill or stationary bike. While you exercise, your heart's activity will be monitored with an ECG, and your blood pressure will be checked. If you are unable to exercise, you may be given a medicine to increase blood flow to parts of your heart.  When blood flow to your heart has peaked, a tracer will again be injected through the IV tube.  After 20-40 minutes, you will get back on the exam table  and have more images taken of your heart.  When the procedure is over, your IV tube will be removed. The procedure may vary among health care providers and hospitals. Depending on the type of tracer used, scans may need to be repeated 3-4 hours later. What happens after the procedure?  Unless your health care provider tells you otherwise, you may return to your normal schedule, including diet, activities, and  medicines.  Unless your health care provider tells you otherwise, you may increase your fluid intake. This will help flush the contrast dye from your body. Drink enough fluid to keep your urine clear or pale yellow.  It is up to you to get your test results. Ask your health care provider, or the department that is doing the test, when your results will be ready. Summary  A cardiac nuclear scan measures the blood flow to the heart when a person is resting and when he or she is exercising.  You may need this test if you are at risk for heart disease.  Tell your health care provider if you are pregnant.  Unless your health care provider tells you otherwise, increase your fluid intake. This will help flush the contrast dye from your body. Drink enough fluid to keep your urine clear or pale yellow. This information is not intended to replace advice given to you by your health care provider. Make sure you discuss any questions you have with your health care provider. Document Released: 05/27/2004 Document Revised: 05/04/2016 Document Reviewed: 04/10/2013 Elsevier Interactive Patient Education  2017 [employer].

## 2018-01-29 DIAGNOSIS — Z23 Encounter for immunization: Secondary | ICD-10-CM | POA: Diagnosis not present

## 2018-01-31 ENCOUNTER — Telehealth (HOSPITAL_COMMUNITY): Payer: Self-pay | Admitting: *Deleted

## 2018-01-31 NOTE — Telephone Encounter (Signed)
Patient given detailed instructions per Myocardial Perfusion Study Information Sheet for the test on 02/06/18. Patient notified to arrive 15 minutes early and that it is imperative to arrive on time for appointment to keep from having the test rescheduled.  If you need to cancel or reschedule your appointment, please call the office within 24 hours of your appointment. . Patient verbalized understanding. Daniel Adkins

## 2018-02-06 ENCOUNTER — Ambulatory Visit (INDEPENDENT_AMBULATORY_CARE_PROVIDER_SITE_OTHER): Payer: Medicare Other

## 2018-02-06 VITALS — Ht 70.0 in | Wt 234.0 lb

## 2018-02-06 DIAGNOSIS — E7800 Pure hypercholesterolemia, unspecified: Secondary | ICD-10-CM

## 2018-02-06 DIAGNOSIS — I25119 Atherosclerotic heart disease of native coronary artery with unspecified angina pectoris: Secondary | ICD-10-CM

## 2018-02-06 LAB — MYOCARDIAL PERFUSION IMAGING
CHL CUP MPHR: 147 {beats}/min
CHL CUP NUCLEAR SDS: 0
CHL CUP NUCLEAR SRS: 0
CHL CUP RESTING HR STRESS: 52 {beats}/min
CSEPED: 7 min
CSEPEW: 9.2 METS
Exercise duration (sec): 50 s
LV sys vol: 37 mL
LVDIAVOL: 88 mL (ref 62–150)
Peak HR: 139 {beats}/min
Percent HR: 94 %
SSS: 0
TID: 1.09

## 2018-02-06 MED ORDER — TECHNETIUM TC 99M TETROFOSMIN IV KIT
31.3000 | PACK | Freq: Once | INTRAVENOUS | Status: AC | PRN
  Administered 2018-02-06: 31.3 via INTRAVENOUS

## 2018-02-06 MED ORDER — TECHNETIUM TC 99M TETROFOSMIN IV KIT
10.1000 | PACK | Freq: Once | INTRAVENOUS | Status: AC | PRN
  Administered 2018-02-06: 10.1 via INTRAVENOUS

## 2018-02-08 DIAGNOSIS — H02112 Cicatricial ectropion of right lower eyelid: Secondary | ICD-10-CM | POA: Diagnosis not present

## 2018-02-08 DIAGNOSIS — H02115 Cicatricial ectropion of left lower eyelid: Secondary | ICD-10-CM | POA: Diagnosis not present

## 2018-02-12 ENCOUNTER — Encounter: Payer: Self-pay | Admitting: Cardiology

## 2018-02-12 DIAGNOSIS — N529 Male erectile dysfunction, unspecified: Secondary | ICD-10-CM | POA: Diagnosis not present

## 2018-02-12 DIAGNOSIS — E7800 Pure hypercholesterolemia, unspecified: Secondary | ICD-10-CM | POA: Diagnosis not present

## 2018-02-20 DIAGNOSIS — M62830 Muscle spasm of back: Secondary | ICD-10-CM | POA: Diagnosis not present

## 2018-02-20 DIAGNOSIS — M9903 Segmental and somatic dysfunction of lumbar region: Secondary | ICD-10-CM | POA: Diagnosis not present

## 2018-02-20 DIAGNOSIS — M545 Low back pain: Secondary | ICD-10-CM | POA: Diagnosis not present

## 2018-02-20 DIAGNOSIS — M9905 Segmental and somatic dysfunction of pelvic region: Secondary | ICD-10-CM | POA: Diagnosis not present

## 2018-02-26 DIAGNOSIS — M545 Low back pain: Secondary | ICD-10-CM | POA: Diagnosis not present

## 2018-02-26 DIAGNOSIS — M9905 Segmental and somatic dysfunction of pelvic region: Secondary | ICD-10-CM | POA: Diagnosis not present

## 2018-02-26 DIAGNOSIS — M9903 Segmental and somatic dysfunction of lumbar region: Secondary | ICD-10-CM | POA: Diagnosis not present

## 2018-02-26 DIAGNOSIS — M62830 Muscle spasm of back: Secondary | ICD-10-CM | POA: Diagnosis not present

## 2018-03-05 DIAGNOSIS — M545 Low back pain: Secondary | ICD-10-CM | POA: Diagnosis not present

## 2018-03-05 DIAGNOSIS — M9905 Segmental and somatic dysfunction of pelvic region: Secondary | ICD-10-CM | POA: Diagnosis not present

## 2018-03-05 DIAGNOSIS — M62830 Muscle spasm of back: Secondary | ICD-10-CM | POA: Diagnosis not present

## 2018-03-05 DIAGNOSIS — M9903 Segmental and somatic dysfunction of lumbar region: Secondary | ICD-10-CM | POA: Diagnosis not present

## 2018-04-18 DIAGNOSIS — M5416 Radiculopathy, lumbar region: Secondary | ICD-10-CM | POA: Diagnosis not present

## 2018-04-18 DIAGNOSIS — M545 Low back pain: Secondary | ICD-10-CM | POA: Diagnosis not present

## 2018-04-19 DIAGNOSIS — Z1389 Encounter for screening for other disorder: Secondary | ICD-10-CM | POA: Diagnosis not present

## 2018-04-19 DIAGNOSIS — I739 Peripheral vascular disease, unspecified: Secondary | ICD-10-CM | POA: Diagnosis not present

## 2018-04-19 DIAGNOSIS — M150 Primary generalized (osteo)arthritis: Secondary | ICD-10-CM | POA: Diagnosis not present

## 2018-04-19 DIAGNOSIS — R7303 Prediabetes: Secondary | ICD-10-CM | POA: Diagnosis not present

## 2018-04-19 DIAGNOSIS — E7800 Pure hypercholesterolemia, unspecified: Secondary | ICD-10-CM | POA: Diagnosis not present

## 2018-04-19 DIAGNOSIS — G4733 Obstructive sleep apnea (adult) (pediatric): Secondary | ICD-10-CM | POA: Diagnosis not present

## 2018-04-19 DIAGNOSIS — I2510 Atherosclerotic heart disease of native coronary artery without angina pectoris: Secondary | ICD-10-CM | POA: Diagnosis not present

## 2018-04-19 DIAGNOSIS — N529 Male erectile dysfunction, unspecified: Secondary | ICD-10-CM | POA: Diagnosis not present

## 2018-04-19 DIAGNOSIS — E291 Testicular hypofunction: Secondary | ICD-10-CM | POA: Diagnosis not present

## 2018-04-19 DIAGNOSIS — D696 Thrombocytopenia, unspecified: Secondary | ICD-10-CM | POA: Diagnosis not present

## 2018-04-19 DIAGNOSIS — D751 Secondary polycythemia: Secondary | ICD-10-CM | POA: Diagnosis not present

## 2018-04-19 DIAGNOSIS — Z6834 Body mass index (BMI) 34.0-34.9, adult: Secondary | ICD-10-CM | POA: Diagnosis not present

## 2018-04-19 DIAGNOSIS — R739 Hyperglycemia, unspecified: Secondary | ICD-10-CM | POA: Diagnosis not present

## 2018-04-20 DIAGNOSIS — M545 Low back pain: Secondary | ICD-10-CM | POA: Diagnosis not present

## 2018-04-27 DIAGNOSIS — M5116 Intervertebral disc disorders with radiculopathy, lumbar region: Secondary | ICD-10-CM | POA: Diagnosis not present

## 2018-04-27 DIAGNOSIS — M48061 Spinal stenosis, lumbar region without neurogenic claudication: Secondary | ICD-10-CM | POA: Diagnosis not present

## 2018-04-27 DIAGNOSIS — M48062 Spinal stenosis, lumbar region with neurogenic claudication: Secondary | ICD-10-CM | POA: Diagnosis not present

## 2018-06-28 ENCOUNTER — Ambulatory Visit (HOSPITAL_COMMUNITY)
Admission: RE | Admit: 2018-06-28 | Discharge: 2018-06-28 | Disposition: A | Discharge status: Home / Self Care | Payer: Medicare Other | Source: Ambulatory Visit | Attending: Cardiology | Admitting: Cardiology

## 2018-06-28 ENCOUNTER — Other Ambulatory Visit: Payer: Self-pay | Admitting: Cardiovascular Disease

## 2018-06-28 DIAGNOSIS — I739 Peripheral vascular disease, unspecified: Secondary | ICD-10-CM | POA: Diagnosis not present

## 2018-06-28 DIAGNOSIS — Z95820 Peripheral vascular angioplasty status with implants and grafts: Secondary | ICD-10-CM

## 2018-06-29 ENCOUNTER — Other Ambulatory Visit: Payer: Self-pay | Admitting: *Deleted

## 2018-06-29 DIAGNOSIS — I739 Peripheral vascular disease, unspecified: Secondary | ICD-10-CM

## 2018-07-03 ENCOUNTER — Other Ambulatory Visit: Payer: Self-pay | Admitting: *Deleted

## 2018-07-03 DIAGNOSIS — I739 Peripheral vascular disease, unspecified: Secondary | ICD-10-CM

## 2018-10-16 DIAGNOSIS — Z87891 Personal history of nicotine dependence: Secondary | ICD-10-CM | POA: Diagnosis not present

## 2018-10-16 DIAGNOSIS — H6121 Impacted cerumen, right ear: Secondary | ICD-10-CM

## 2018-10-16 DIAGNOSIS — Z974 Presence of external hearing-aid: Secondary | ICD-10-CM | POA: Diagnosis not present

## 2018-10-16 DIAGNOSIS — Z9622 Myringotomy tube(s) status: Secondary | ICD-10-CM | POA: Diagnosis not present

## 2018-10-16 HISTORY — DX: Impacted cerumen, right ear: H61.21

## 2018-10-26 DIAGNOSIS — M48062 Spinal stenosis, lumbar region with neurogenic claudication: Secondary | ICD-10-CM | POA: Diagnosis not present

## 2018-10-26 DIAGNOSIS — M4727 Other spondylosis with radiculopathy, lumbosacral region: Secondary | ICD-10-CM | POA: Diagnosis not present

## 2018-10-26 DIAGNOSIS — Z6835 Body mass index (BMI) 35.0-35.9, adult: Secondary | ICD-10-CM | POA: Diagnosis not present

## 2018-10-26 DIAGNOSIS — M5126 Other intervertebral disc displacement, lumbar region: Secondary | ICD-10-CM | POA: Diagnosis not present

## 2018-10-26 DIAGNOSIS — M5136 Other intervertebral disc degeneration, lumbar region: Secondary | ICD-10-CM | POA: Diagnosis not present

## 2018-10-26 DIAGNOSIS — I10 Essential (primary) hypertension: Secondary | ICD-10-CM | POA: Diagnosis not present

## 2018-10-26 DIAGNOSIS — M546 Pain in thoracic spine: Secondary | ICD-10-CM | POA: Diagnosis not present

## 2018-10-26 DIAGNOSIS — M549 Dorsalgia, unspecified: Secondary | ICD-10-CM | POA: Diagnosis not present

## 2018-11-19 DIAGNOSIS — M48062 Spinal stenosis, lumbar region with neurogenic claudication: Secondary | ICD-10-CM | POA: Diagnosis not present

## 2018-11-19 DIAGNOSIS — M4727 Other spondylosis with radiculopathy, lumbosacral region: Secondary | ICD-10-CM | POA: Diagnosis not present

## 2018-11-19 DIAGNOSIS — M5126 Other intervertebral disc displacement, lumbar region: Secondary | ICD-10-CM | POA: Diagnosis not present

## 2018-11-19 DIAGNOSIS — M5136 Other intervertebral disc degeneration, lumbar region: Secondary | ICD-10-CM | POA: Diagnosis not present

## 2018-11-29 DIAGNOSIS — M5116 Intervertebral disc disorders with radiculopathy, lumbar region: Secondary | ICD-10-CM | POA: Diagnosis not present

## 2018-11-29 DIAGNOSIS — M4726 Other spondylosis with radiculopathy, lumbar region: Secondary | ICD-10-CM | POA: Diagnosis not present

## 2018-11-29 DIAGNOSIS — M48061 Spinal stenosis, lumbar region without neurogenic claudication: Secondary | ICD-10-CM | POA: Diagnosis not present

## 2019-01-18 DIAGNOSIS — M4727 Other spondylosis with radiculopathy, lumbosacral region: Secondary | ICD-10-CM | POA: Diagnosis not present

## 2019-01-18 DIAGNOSIS — M5126 Other intervertebral disc displacement, lumbar region: Secondary | ICD-10-CM | POA: Diagnosis not present

## 2019-01-18 DIAGNOSIS — M48062 Spinal stenosis, lumbar region with neurogenic claudication: Secondary | ICD-10-CM | POA: Diagnosis not present

## 2019-01-18 DIAGNOSIS — M5136 Other intervertebral disc degeneration, lumbar region: Secondary | ICD-10-CM | POA: Diagnosis not present

## 2019-01-29 DIAGNOSIS — Z23 Encounter for immunization: Secondary | ICD-10-CM | POA: Diagnosis not present

## 2019-04-10 ENCOUNTER — Other Ambulatory Visit: Payer: Self-pay

## 2019-04-22 DIAGNOSIS — I2510 Atherosclerotic heart disease of native coronary artery without angina pectoris: Secondary | ICD-10-CM | POA: Diagnosis not present

## 2019-04-22 DIAGNOSIS — E291 Testicular hypofunction: Secondary | ICD-10-CM | POA: Diagnosis not present

## 2019-04-22 DIAGNOSIS — N400 Enlarged prostate without lower urinary tract symptoms: Secondary | ICD-10-CM | POA: Diagnosis not present

## 2019-04-22 DIAGNOSIS — M5136 Other intervertebral disc degeneration, lumbar region: Secondary | ICD-10-CM | POA: Diagnosis not present

## 2019-04-22 DIAGNOSIS — Z1389 Encounter for screening for other disorder: Secondary | ICD-10-CM | POA: Diagnosis not present

## 2019-04-22 DIAGNOSIS — M48061 Spinal stenosis, lumbar region without neurogenic claudication: Secondary | ICD-10-CM | POA: Diagnosis not present

## 2019-04-22 DIAGNOSIS — D751 Secondary polycythemia: Secondary | ICD-10-CM | POA: Diagnosis not present

## 2019-04-22 DIAGNOSIS — E7800 Pure hypercholesterolemia, unspecified: Secondary | ICD-10-CM | POA: Diagnosis not present

## 2019-04-22 DIAGNOSIS — I10 Essential (primary) hypertension: Secondary | ICD-10-CM | POA: Diagnosis not present

## 2019-04-22 DIAGNOSIS — Z6834 Body mass index (BMI) 34.0-34.9, adult: Secondary | ICD-10-CM | POA: Diagnosis not present

## 2019-04-22 DIAGNOSIS — R739 Hyperglycemia, unspecified: Secondary | ICD-10-CM | POA: Diagnosis not present

## 2019-04-22 DIAGNOSIS — I739 Peripheral vascular disease, unspecified: Secondary | ICD-10-CM | POA: Diagnosis not present

## 2019-04-22 DIAGNOSIS — M8949 Other hypertrophic osteoarthropathy, multiple sites: Secondary | ICD-10-CM | POA: Diagnosis not present

## 2019-04-23 DIAGNOSIS — J320 Chronic maxillary sinusitis: Secondary | ICD-10-CM | POA: Diagnosis not present

## 2019-04-23 DIAGNOSIS — Z9622 Myringotomy tube(s) status: Secondary | ICD-10-CM | POA: Diagnosis not present

## 2019-04-23 DIAGNOSIS — H903 Sensorineural hearing loss, bilateral: Secondary | ICD-10-CM | POA: Diagnosis not present

## 2019-04-23 DIAGNOSIS — H6981 Other specified disorders of Eustachian tube, right ear: Secondary | ICD-10-CM | POA: Diagnosis not present

## 2019-04-23 HISTORY — DX: Chronic maxillary sinusitis: J32.0

## 2019-04-24 DIAGNOSIS — H2703 Aphakia, bilateral: Secondary | ICD-10-CM | POA: Diagnosis not present

## 2019-06-29 DIAGNOSIS — Z23 Encounter for immunization: Secondary | ICD-10-CM | POA: Diagnosis not present

## 2019-07-02 ENCOUNTER — Ambulatory Visit (HOSPITAL_COMMUNITY)
Admission: RE | Admit: 2019-07-02 | Discharge: 2019-07-02 | Disposition: A | Discharge status: Home / Self Care | Payer: Medicare Other | Source: Ambulatory Visit | Attending: Cardiovascular Disease | Admitting: Cardiovascular Disease

## 2019-07-02 ENCOUNTER — Other Ambulatory Visit: Payer: Self-pay

## 2019-07-02 DIAGNOSIS — Z95820 Peripheral vascular angioplasty status with implants and grafts: Secondary | ICD-10-CM

## 2019-07-02 DIAGNOSIS — I25119 Atherosclerotic heart disease of native coronary artery with unspecified angina pectoris: Secondary | ICD-10-CM

## 2019-07-02 DIAGNOSIS — I739 Peripheral vascular disease, unspecified: Secondary | ICD-10-CM | POA: Insufficient documentation

## 2019-07-04 ENCOUNTER — Ambulatory Visit: Payer: Medicare Other | Admitting: Cardiology

## 2019-07-04 ENCOUNTER — Other Ambulatory Visit: Payer: Self-pay

## 2019-07-04 DIAGNOSIS — I739 Peripheral vascular disease, unspecified: Secondary | ICD-10-CM

## 2019-07-27 DIAGNOSIS — Z23 Encounter for immunization: Secondary | ICD-10-CM | POA: Diagnosis not present

## 2019-08-06 NOTE — Progress Notes (Signed)
Cardiology Office Note:    Date:  08/07/2019   ID:  Daniel Adkins, DOB 28-Oct-1945, MRN [id number]  PCP:  Townsend Roger, MD  Cardiologist:  Shirlee More, MD    Referring MD: Townsend Roger, MD    ASSESSMENT:    1. Coronary artery disease involving native coronary artery of native heart with angina pectoris ([place])   2. Essential hypertension   3. Pure hypercholesterolemia   4. Peripheral vascular disease with claudication ([place])    PLAN:    In order of problems listed above:  1. Stable CAD presently having no angina continue his chronic dual antiplatelet therapy along with lipid-lowering with Repatha and presently is not on antianginal medications has not required nitroglycerin. 2. BP at target continue treatment ACE inhibitor 3. Improved continue PCSK9 inhibitor 4. Stable having no claudication.  He had ABI performed last month which was normal bilaterally and lower extremity vascular duplex independently reviewed by me shows patent stent without evidence of stenosis in the distal SFA popliteal artery.  Continue to follow with Dr. Alvester Chou   Next appointment: 6 months   Medication Adjustments/Labs and Tests Ordered: Current medicines are reviewed at length with the patient today.  Concerns regarding medicines are outlined above.  Orders Placed This Encounter  Procedures  . EKG 12-Lead   No orders of the defined types were placed in this encounter.   Chief Complaint  Patient presents with  . Follow-up  . Coronary Artery Disease    History of Present Illness:    Daniel Adkins is a 74 y.o. male with a hx of CAD with PCI of Om1 Oct 2014 and PCI  And PCI of [facility] and Om1 June 2015   Hypertension hyperlipidemia and PAD last seen by me 06/22/16 at [facility].Most recently he had PCI and stent  Of LAD 12/22/15  last seen 01/17/2018. Compliance with diet, lifestyle and medications: Yes  In general he is done well has had no anginal discomfort he just notices he fatigues  more than before and unfortunately is become sedentary during the winter.  He has had no angina claudication edema shortness of breath palpitation or syncope.  His lipids are followed in his PCP office and he tells me his total cholesterol is less than 80.  His last lipid profile available to me 04/23/2019 has a cholesterol of 80 HDL 42 LDL of 24 and he tolerates PCSK9 inhibitor without muscle pain or weakness. Past Medical History:  Diagnosis Date  . Arthritis 07/04/2017  . CAD (coronary artery disease)    cath 2002- med treatment  . Chronic maxillary sinusitis 04/23/2019  . Coronary artery disease involving native coronary artery of native heart with angina pectoris ([place]) 10/18/2012   Coronary artery disease   . Difficult intubation    "I have a short neck" (10/30/2012)  . Gout   . Hyperlipidemia   . Hypertension   . Hypertensive disorder 07/04/2017   hypertension  . Obstructive sleep apnea 09/18/2011   Overview:  CPAP  . OSA on CPAP   . PAD (peripheral artery disease) ([place])   . Peripheral arterial occlusive disease ([place]) 10/18/2012   Overview:  Overview:  Peripheral Vascular disease  Last Assessment & Plan:  History of peripheral vascular disease status post diamondback orbital rotational atherectomy, PTCA and stenting of a highly calcified distal left SFA 10/30/12. He did have 2 vessel runoff bilaterally with moderate right SFA disease as well. He was complaining of some atypical left thigh pain. Lower extremity Dopplers  perfo  . Peripheral vascular disease with claudication ([place]) 10/18/2012   Peripheral Vascular disease   . Right ear impacted cerumen 10/16/2018    Past Surgical History:  Procedure Laterality Date  . ATHERECTOMY N/A 10/30/2012   Procedure: ATHERECTOMY;  Surgeon: Lorretta Harp, MD;  Location: [facility] CATH LAB;  Service: Cardiovascular;  Laterality: N/A;  . CARDIAC CATHETERIZATION  2002   medical treatment (done at [facility])  . CATARACT EXTRACTION    . CHOLECYSTECTOMY      . CORONARY ANGIOPLASTY WITH STENT PLACEMENT  10/30/2012   "1" (10/30/2012)  . KNEE ARTHROSCOPY W/ MENISCAL REPAIR Left ~ 2008  . Stent to Femoral Artery    . TONSILLECTOMY  1950's   "as a kid" (10/30/2012)    Current Medications: Current Meds  Medication Sig  . aspirin EC 81 MG tablet Take 81 mg by mouth daily.  . clopidogrel (PLAVIX) 75 MG tablet Take 1 tablet (75 mg total) by mouth daily with breakfast.  . Evolocumab (REPATHA SURECLICK) XX123456 MG/ML SOAJ Inject 560 mg into the skin every 30 (thirty) days. 140 mg 3 times per month  . folic acid (FOLVITE) 1 MG tablet Take 1 tablet by mouth daily.  Marland Kitchen lisinopril (PRINIVIL,ZESTRIL) 20 MG tablet Take 20 mg by mouth daily.  . Methotrexate, PF, 25 MG/0.5ML SOAJ Inject into the skin once a week.  . Omega-3 Fatty Acids (FISH OIL) 1200 MG CAPS Take 1 capsule by mouth daily.  Marland Kitchen omeprazole (PRILOSEC) 40 MG capsule Take 40 mg by mouth daily.     Allergies:   Cephalosporins, Aspirin, Rosuvastatin, and Sulfa antibiotics   Social History   Socioeconomic History  . Marital status: Single    Spouse name: Not on file  . Number of children: Not on file  . Years of education: Not on file  . Highest education level: Not on file  Occupational History  . Not on file  Tobacco Use  . Smoking status: Former Smoker    Years: 37.00    Types: Cigars    Quit date: 10/19/1995    Years since quitting: 23.8  . Smokeless tobacco: Never Used  . Tobacco comment: 10/30/2012 "last smoked 5 swisher sweets/day when I quit smoking"  Substance and Sexual Activity  . Alcohol use: Yes    Comment: 10/30/2012 "mixed drink usually once/month; sometimes twice"  . Drug use: No  . Sexual activity: Yes  Other Topics Concern  . Not on file  Social History Narrative  . Not on file   Social Determinants of Health   Financial Resource Strain:   . Difficulty of Paying Living Expenses:   Food Insecurity:   . Worried About Charity fundraiser in the Last Year:   . Academic librarian in the Last Year:   Transportation Needs:   . Film/video editor (Medical):   Marland Kitchen Lack of Transportation (Non-Medical):   Physical Activity:   . Days of Exercise per Week:   . Minutes of Exercise per Session:   Stress:   . Feeling of Stress :   Social Connections:   . Frequency of Communication with Friends and Family:   . Frequency of Social Gatherings with Friends and Family:   . Attends Religious Services:   . Active Member of Clubs or Organizations:   . Attends Archivist Meetings:   Marland Kitchen Marital Status:      Family History: The patient's family history includes Cancer in his sister; Coronary artery disease in  his father; Heart attack in his father. ROS:   Please see the history of present illness.    All other systems reviewed and are negative.  EKGs/Labs/Other Studies Reviewed:    The following studies were reviewed today:  EKG:  EKG ordered today and personally reviewed.  The ekg ordered today demonstrates sinus rhythm normal    Physical Exam:    VS:  BP 110/60   Pulse 66   Temp (!) 97.1 F (36.2 C)   Ht 5\' 9"  (1.753 m)   Wt 224 lb (101.6 kg)   SpO2 97%   BMI 33.08 kg/m     Wt Readings from Last 3 Encounters:  08/07/19 224 lb (101.6 kg)  02/06/18 234 lb (106.1 kg)  01/17/18 234 lb 6.4 oz (106.3 kg)     GEN: Central obesity well nourished, well developed in no acute distress HEENT: Normal NECK: No JVD; No carotid bruits LYMPHATICS: No lymphadenopathy CARDIAC: RRR, no murmurs, rubs, gallops RESPIRATORY:  Clear to auscultation without rales, wheezing or rhonchi  ABDOMEN: Soft, non-tender, non-distended MUSCULOSKELETAL:  No edema; No deformity  SKIN: Warm and dry NEUROLOGIC:  Alert and oriented x 3 PSYCHIATRIC:  Normal affect    Signed, Shirlee More, MD  08/07/2019 5:02 PM    [facility]

## 2019-08-07 ENCOUNTER — Ambulatory Visit: Payer: Medicare Other | Admitting: Cardiology

## 2019-08-07 ENCOUNTER — Ambulatory Visit (INDEPENDENT_AMBULATORY_CARE_PROVIDER_SITE_OTHER): Payer: Medicare Other | Admitting: Cardiology

## 2019-08-07 ENCOUNTER — Other Ambulatory Visit: Payer: Self-pay

## 2019-08-07 VITALS — BP 110/60 | HR 66 | Temp 97.1°F | Ht 69.0 in | Wt 224.0 lb

## 2019-08-07 DIAGNOSIS — I739 Peripheral vascular disease, unspecified: Secondary | ICD-10-CM | POA: Diagnosis not present

## 2019-08-07 DIAGNOSIS — E7800 Pure hypercholesterolemia, unspecified: Secondary | ICD-10-CM | POA: Diagnosis not present

## 2019-08-07 DIAGNOSIS — I10 Essential (primary) hypertension: Secondary | ICD-10-CM

## 2019-08-07 DIAGNOSIS — I25119 Atherosclerotic heart disease of native coronary artery with unspecified angina pectoris: Secondary | ICD-10-CM

## 2019-08-07 NOTE — Patient Instructions (Signed)
Medication Instructions:  Your physician recommends that you continue on your current medications as directed. Please refer to the Current Medication list given to you today.  *If you need a refill on your cardiac medications before your next appointment, please call your pharmacy*   Lab Work: None ordered   If you have labs (blood work) drawn today and your tests are completely normal, you will receive your results only by: . MyChart Message (if you have MyChart) OR . A paper copy in the mail If you have any lab test that is abnormal or we need to change your treatment, we will call you to review the results.   Testing/Procedures: None ordered    Follow-Up: At [facility], you and your health needs are our priority.  As part of our continuing mission to provide you with exceptional heart care, we have created designated Provider Care Teams.  These Care Teams include your primary Cardiologist (physician) and Advanced Practice Providers (APPs -  Physician Assistants and Nurse Practitioners) who all work together to provide you with the care you need, when you need it.  We recommend signing up for the patient portal called "MyChart".  Sign up information is provided on this After Visit Summary.  MyChart is used to connect with patients for Virtual Visits (Telemedicine).  Patients are able to view lab/test results, encounter notes, upcoming appointments, etc.  Non-urgent messages can be sent to your provider as well.   To learn more about what you can do with MyChart, go to [email or web address].    Your next appointment:   6 month(s)  The format for your next appointment:   In Person  Provider:   Brian Munley, MD   Other Instructions None   

## 2019-09-12 DIAGNOSIS — Z9622 Myringotomy tube(s) status: Secondary | ICD-10-CM | POA: Diagnosis not present

## 2019-09-12 DIAGNOSIS — H6122 Impacted cerumen, left ear: Secondary | ICD-10-CM | POA: Diagnosis not present

## 2019-09-12 DIAGNOSIS — H938X1 Other specified disorders of right ear: Secondary | ICD-10-CM | POA: Diagnosis not present

## 2019-09-12 DIAGNOSIS — Z974 Presence of external hearing-aid: Secondary | ICD-10-CM | POA: Diagnosis not present

## 2019-10-29 DIAGNOSIS — Z6832 Body mass index (BMI) 32.0-32.9, adult: Secondary | ICD-10-CM | POA: Diagnosis not present

## 2019-10-29 DIAGNOSIS — M549 Dorsalgia, unspecified: Secondary | ICD-10-CM | POA: Diagnosis not present

## 2019-10-29 DIAGNOSIS — I10 Essential (primary) hypertension: Secondary | ICD-10-CM | POA: Diagnosis not present

## 2019-10-30 ENCOUNTER — Telehealth: Payer: Self-pay | Admitting: Cardiology

## 2019-10-30 NOTE — Telephone Encounter (Signed)
New Message  Pt called and would like to speak with Dr. Bettina Gavia or nurse. Please call back

## 2019-10-30 NOTE — Telephone Encounter (Signed)
Spoke to patient just now. He let me know that his Dr. [place] was waiting to hear back from [place] in regards to the patient getting an epidural for surgery. I told the patient that I did call the office and left a message for them to please return our call and I let them know that the patient was taking Plavix as this was the question that I was asked when receiving a call from them. I also let him know that their office should send [place] a faxed note describing exactly what they need from our office. He verbalizes understanding and states that he will do this.    Encouraged patient to call back with any questions or concerns.

## 2019-10-30 NOTE — Telephone Encounter (Signed)
Left a voicemail for Daniel Adkins letting her know that it looks like Dr. Bettina Gavia is not the one who originally ordered the Plavix but we do have him taking it and he was told to continue taking it at his last appointment in March. I also left her our call back number to call back with any other issues or concerns.

## 2019-10-30 NOTE — Telephone Encounter (Signed)
New Message:   Daniel Adkins  From Dr Maryjean Ka office called. She wanted to know if Dr Bettina Gavia was prescribing Plavix for patient? If she does not answer, please leave a message.

## 2019-10-31 ENCOUNTER — Telehealth: Payer: Self-pay | Admitting: Cardiology

## 2019-10-31 NOTE — Telephone Encounter (Signed)
Transferred call to Tiffany in regards to a fax that was faxed over from [facility]

## 2019-11-04 ENCOUNTER — Telehealth: Payer: Self-pay

## 2019-11-04 NOTE — Telephone Encounter (Signed)
   Primary Cardiologist: Shirlee More, MD  Chart reviewed as part of pre-operative protocol coverage.  Hx of CAD s/p multiple stent, last 12/2015 (last stress test 01/2018 without evidence of ischemia/infration), PAD s/p stenting (patent stent by last LE arterial dopper/ABIs 06/2019), HTN and HLD.   Unable to reach the patient to go over cardiac symptoms. Voicemail is full.   Dr. Bettina Gavia, can patient hold Plavix?  Please forward your response to P CV DIV PREOP.   Thank you   Leanor Kail, PA 11/04/2019, 12:16 PM

## 2019-11-04 NOTE — Telephone Encounter (Signed)
° °  [facility] Pre-operative Risk Assessment    HEARTCARE STAFF: - Please ensure there is not already an duplicate clearance open for this procedure. - Under Visit Info/Reason for Call, type in Other and utilize the format Clearance MM/DD/YY or Clearance TBD. Do not use dashes or single digits. - If request is for dental extraction, please clarify the # of teeth to be extracted.  Request for surgical clearance:  1. What type of surgery is being performed? TFESF Left L 3-4   2. When is this surgery scheduled? 12/17/19   3. What type of clearance is required (medical clearance vs. Pharmacy clearance to hold med vs. Both)? Medical/Pharmacy  4. Are there any medications that need to be held prior to surgery and how long?  Plavix for 7 days prior   5. Practice name and name of physician performing surgery? [facility]   6. What is the office phone number? [phone]   7.   What is the office fax number? [phone]  8.   Anesthesia type (None, local, MAC, general) ? Unspecified   Gita Kudo 11/04/2019, 11:36 AM  _________________________________________________________________   (provider comments below)

## 2019-11-04 NOTE — Telephone Encounter (Signed)
Yes he can hold plavix

## 2019-11-05 NOTE — Telephone Encounter (Signed)
Left voice mail (on home # [phone]) to call back for surgical clearance.

## 2019-11-07 NOTE — Telephone Encounter (Signed)
   Primary Cardiologist: Shirlee More, MD  Chart reviewed as part of pre-operative protocol coverage. Patient was contacted 11/07/2019 in reference to pre-operative risk assessment for pending surgery as outlined below.  Daniel Adkins was last seen on 08/07/2019 by Dr. Bettina Gavia.  Since that day, Daniel Adkins has done well without significant chest pain or shortness of breath.  Therefore, based on ACC/AHA guidelines, the patient would be at acceptable risk for the planned procedure without further cardiovascular testing.   I will route this recommendation to the requesting party via Epic fax function and remove from pre-op pool. Please call with questions.  He is cleared to hold plavix for 7 days prior to the procedure and restart as soon as possible afterward at the surgeon's discretion.   [place], [place] 11/07/2019, 3:31 PM

## 2019-11-07 NOTE — Telephone Encounter (Signed)
Type of surgery is listed wrong. It should be TFESI (transforaminal epidural steroid injection) not TFESF. This has been confirmed with the requesting provider's office.   I called the patient's cell phone, voicemail box is full, unable to leave message  I eventually left message on his wife's phone, requesting the patient to call us and speak to the on call preop APP of the day so we can hopefully clear him over the phone.

## 2019-12-17 DIAGNOSIS — M48062 Spinal stenosis, lumbar region with neurogenic claudication: Secondary | ICD-10-CM | POA: Diagnosis not present

## 2020-02-13 DIAGNOSIS — M48062 Spinal stenosis, lumbar region with neurogenic claudication: Secondary | ICD-10-CM | POA: Diagnosis not present

## 2020-02-20 IMAGING — NM NM CHEST EXAM
6 series · 36 of 36 positions shown · non-contrast
Comparison: none

[Series 1: wbr rest · 6.40mm/px · 6 of 64 frames shown]
[frame 6/64]
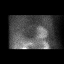
[frame 16/64]
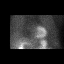
[frame 27/64]
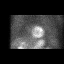
[frame 38/64]
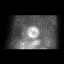
[frame 48/64]
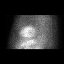
[frame 59/64]
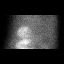

[Series 1: rest sax · 6.4mm · 6.40mm/px · 6 of 23 frames shown]
[frame 2/23]
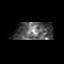
[frame 6/23]
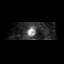
[frame 10/23]
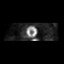
[frame 14/23]
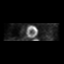
[frame 18/23]
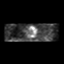
[frame 22/23]
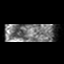

[Series 2: wbr stress-gsp · 6.40mm/px · 6 of 507 frames shown]
[frame 43/507  full-range]
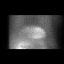
[frame 127/507  full-range]
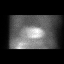
[frame 212/507  full-range]
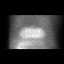
[frame 296/507  full-range]
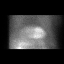
[frame 381/507  full-range]
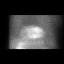
[frame 465/507  full-range]
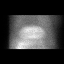

[Series 2: stress sax gs · 6.4mm · 6.40mm/px · 6 of 184 frames shown]
[frame 16/184]
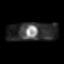
[frame 46/184]
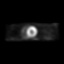
[frame 77/184]
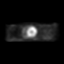
[frame 108/184]
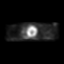
[frame 138/184]
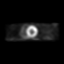
[frame 169/184]
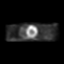

[Series 3: stress sax · 6.4mm · 6.40mm/px · 6 of 23 frames shown]
[frame 2/23]
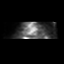
[frame 6/23]
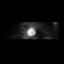
[frame 10/23]
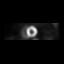
[frame 14/23]
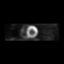
[frame 18/23]
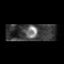
[frame 22/23]
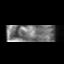

[Series 3: wbr stress-sum-em · 6.40mm/px · 6 of 64 frames shown]
[frame 6/64]
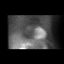
[frame 16/64]
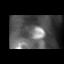
[frame 27/64]
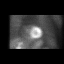
[frame 38/64]
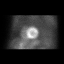
[frame 48/64]
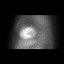
[frame 59/64]
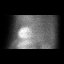

[36 of 36 positions shown; findings below may reference images not displayed]

Canned report from images found in remote index.

Refer to host system for actual result text.

## 2020-03-02 DIAGNOSIS — H903 Sensorineural hearing loss, bilateral: Secondary | ICD-10-CM | POA: Diagnosis not present

## 2020-03-03 DIAGNOSIS — R7303 Prediabetes: Secondary | ICD-10-CM | POA: Diagnosis not present

## 2020-03-03 DIAGNOSIS — R799 Abnormal finding of blood chemistry, unspecified: Secondary | ICD-10-CM | POA: Diagnosis not present

## 2020-03-03 DIAGNOSIS — I10 Essential (primary) hypertension: Secondary | ICD-10-CM | POA: Diagnosis not present

## 2020-03-03 DIAGNOSIS — N400 Enlarged prostate without lower urinary tract symptoms: Secondary | ICD-10-CM | POA: Diagnosis not present

## 2020-03-03 DIAGNOSIS — E7800 Pure hypercholesterolemia, unspecified: Secondary | ICD-10-CM | POA: Diagnosis not present

## 2020-03-03 DIAGNOSIS — R739 Hyperglycemia, unspecified: Secondary | ICD-10-CM | POA: Diagnosis not present

## 2020-05-18 DIAGNOSIS — H9211 Otorrhea, right ear: Secondary | ICD-10-CM | POA: Diagnosis not present

## 2020-05-18 DIAGNOSIS — Z9622 Myringotomy tube(s) status: Secondary | ICD-10-CM | POA: Diagnosis not present

## 2020-05-21 DIAGNOSIS — I2510 Atherosclerotic heart disease of native coronary artery without angina pectoris: Secondary | ICD-10-CM | POA: Diagnosis not present

## 2020-05-21 DIAGNOSIS — Z6833 Body mass index (BMI) 33.0-33.9, adult: Secondary | ICD-10-CM | POA: Diagnosis not present

## 2020-05-21 DIAGNOSIS — Z1331 Encounter for screening for depression: Secondary | ICD-10-CM | POA: Diagnosis not present

## 2020-05-21 DIAGNOSIS — E7800 Pure hypercholesterolemia, unspecified: Secondary | ICD-10-CM | POA: Diagnosis not present

## 2020-05-21 DIAGNOSIS — N400 Enlarged prostate without lower urinary tract symptoms: Secondary | ICD-10-CM | POA: Diagnosis not present

## 2020-05-21 DIAGNOSIS — Z1339 Encounter for screening examination for other mental health and behavioral disorders: Secondary | ICD-10-CM | POA: Diagnosis not present

## 2020-05-21 DIAGNOSIS — I10 Essential (primary) hypertension: Secondary | ICD-10-CM | POA: Diagnosis not present

## 2020-05-21 DIAGNOSIS — E291 Testicular hypofunction: Secondary | ICD-10-CM | POA: Diagnosis not present

## 2020-05-21 DIAGNOSIS — E669 Obesity, unspecified: Secondary | ICD-10-CM | POA: Diagnosis not present

## 2020-05-21 DIAGNOSIS — Z0000 Encounter for general adult medical examination without abnormal findings: Secondary | ICD-10-CM | POA: Diagnosis not present

## 2020-05-21 DIAGNOSIS — I739 Peripheral vascular disease, unspecified: Secondary | ICD-10-CM | POA: Diagnosis not present

## 2020-05-21 DIAGNOSIS — R7303 Prediabetes: Secondary | ICD-10-CM | POA: Diagnosis not present

## 2020-05-22 DIAGNOSIS — H2703 Aphakia, bilateral: Secondary | ICD-10-CM | POA: Diagnosis not present

## 2020-07-03 ENCOUNTER — Ambulatory Visit (HOSPITAL_COMMUNITY)
Admission: RE | Admit: 2020-07-03 | Discharge: 2020-07-03 | Disposition: A | Discharge status: Home / Self Care | Payer: Medicare Other | Source: Ambulatory Visit | Attending: Cardiology | Admitting: Cardiology

## 2020-07-03 ENCOUNTER — Other Ambulatory Visit (HOSPITAL_COMMUNITY): Payer: Self-pay | Admitting: Cardiovascular Disease

## 2020-07-03 ENCOUNTER — Other Ambulatory Visit: Payer: Self-pay

## 2020-07-03 DIAGNOSIS — I25119 Atherosclerotic heart disease of native coronary artery with unspecified angina pectoris: Secondary | ICD-10-CM | POA: Diagnosis not present

## 2020-07-03 DIAGNOSIS — I739 Peripheral vascular disease, unspecified: Secondary | ICD-10-CM

## 2020-07-03 DIAGNOSIS — Z95820 Peripheral vascular angioplasty status with implants and grafts: Secondary | ICD-10-CM | POA: Insufficient documentation

## 2020-07-22 DIAGNOSIS — M7180 Other specified bursopathies, unspecified site: Secondary | ICD-10-CM | POA: Diagnosis not present

## 2020-07-22 DIAGNOSIS — M7031 Other bursitis of elbow, right elbow: Secondary | ICD-10-CM | POA: Diagnosis not present

## 2020-08-03 DIAGNOSIS — M7021 Olecranon bursitis, right elbow: Secondary | ICD-10-CM | POA: Diagnosis not present

## 2020-08-06 DIAGNOSIS — M7180 Other specified bursopathies, unspecified site: Secondary | ICD-10-CM | POA: Diagnosis not present

## 2020-09-17 DIAGNOSIS — M7180 Other specified bursopathies, unspecified site: Secondary | ICD-10-CM | POA: Diagnosis not present

## 2020-09-17 DIAGNOSIS — I2510 Atherosclerotic heart disease of native coronary artery without angina pectoris: Secondary | ICD-10-CM | POA: Diagnosis not present

## 2020-09-17 DIAGNOSIS — E785 Hyperlipidemia, unspecified: Secondary | ICD-10-CM | POA: Diagnosis not present

## 2020-09-17 DIAGNOSIS — R011 Cardiac murmur, unspecified: Secondary | ICD-10-CM | POA: Diagnosis not present

## 2020-09-17 DIAGNOSIS — I10 Essential (primary) hypertension: Secondary | ICD-10-CM | POA: Diagnosis not present

## 2020-09-17 DIAGNOSIS — I951 Orthostatic hypotension: Secondary | ICD-10-CM | POA: Diagnosis not present

## 2020-09-17 DIAGNOSIS — M7021 Olecranon bursitis, right elbow: Secondary | ICD-10-CM | POA: Diagnosis not present

## 2020-09-30 DIAGNOSIS — Z86010 Personal history of colonic polyps: Secondary | ICD-10-CM | POA: Diagnosis not present

## 2020-09-30 DIAGNOSIS — Z1211 Encounter for screening for malignant neoplasm of colon: Secondary | ICD-10-CM | POA: Diagnosis not present

## 2020-09-30 DIAGNOSIS — K5730 Diverticulosis of large intestine without perforation or abscess without bleeding: Secondary | ICD-10-CM | POA: Diagnosis not present

## 2020-10-07 DIAGNOSIS — I951 Orthostatic hypotension: Secondary | ICD-10-CM | POA: Diagnosis not present

## 2020-10-07 DIAGNOSIS — R011 Cardiac murmur, unspecified: Secondary | ICD-10-CM | POA: Diagnosis not present

## 2020-10-07 DIAGNOSIS — I2510 Atherosclerotic heart disease of native coronary artery without angina pectoris: Secondary | ICD-10-CM | POA: Diagnosis not present

## 2020-11-03 DIAGNOSIS — H6981 Other specified disorders of Eustachian tube, right ear: Secondary | ICD-10-CM | POA: Diagnosis not present

## 2020-11-03 DIAGNOSIS — H903 Sensorineural hearing loss, bilateral: Secondary | ICD-10-CM | POA: Diagnosis not present

## 2020-11-03 DIAGNOSIS — Z9622 Myringotomy tube(s) status: Secondary | ICD-10-CM | POA: Diagnosis not present

## 2020-11-24 DIAGNOSIS — M48062 Spinal stenosis, lumbar region with neurogenic claudication: Secondary | ICD-10-CM | POA: Diagnosis not present

## 2020-11-24 DIAGNOSIS — Z6833 Body mass index (BMI) 33.0-33.9, adult: Secondary | ICD-10-CM | POA: Diagnosis not present

## 2020-11-27 ENCOUNTER — Telehealth: Payer: Self-pay

## 2020-11-27 NOTE — Telephone Encounter (Signed)
   [facility] Pre-operative Risk Assessment    Patient Name: Bela Nyborg  DOB: 1945/11/26 MRN: [id number]  HEARTCARE STAFF:  - IMPORTANT!!!!!! Under Visit Info/Reason for Call, type in Other and utilize the format Clearance MM/DD/YY or Clearance TBD. Do not use dashes or single digits. - Please review there is not already an duplicate clearance open for this procedure. - If request is for dental extraction, please clarify the # of teeth to be extracted. - If the patient is currently at the dentist's office, call Pre-Op Callback Staff (MA/nurse) to input urgent request.  - If the patient is not currently in the dentist office, please route to the Pre-Op pool.  Request for surgical clearance:  What type of surgery is being performed? LESI - Left L3 -L4  When is this surgery scheduled? 01/21/2021  What type of clearance is required (medical clearance vs. Pharmacy clearance to hold med vs. Both)? Cardiac   Are there any medications that need to be held prior to surgery and how long? Plavix   Practice name and name of physician performing surgery?  [facility] ; Dr. Lenord Carbo  What is the office phone number? [phone]   7.   What is the office fax number? [phone]  8.   Anesthesia type (None, local, MAC, general) ? N/A   Orvan July 11/27/2020, 5:40 PM  _________________________________________________________________   (provider comments below)

## 2020-11-30 NOTE — Telephone Encounter (Signed)
Primary Cardiologist:Brian Bettina Gavia, MD  Chart reviewed as part of pre-operative protocol coverage. Because of Toshio Slusher past medical history and time since last visit, he/she will require a follow-up visit in order to better assess preoperative cardiovascular risk.  Pre-op covering staff: - Please schedule appointment and call patient to inform them. - Please contact requesting surgeon's office via preferred method (i.e, phone, fax) to inform them of need for appointment prior to surgery.  If applicable, this message will also be routed to pharmacy pool and/or primary cardiologist for input on holding anticoagulant/antiplatelet agent as requested below so that this information is available at time of patient's appointment.   Deberah Pelton, NP  11/30/2020, 12:33 PM

## 2020-11-30 NOTE — Telephone Encounter (Signed)
I s/w the pt and he is agreeable to appt for pre op clearance. Pt will however be seen in the [facility] office with Dr. Godfrey Pick Tobb 12/14/20 @ 1:20. Pt is agreeable to appt date, time and location. Pt has been given the address for [facility] office. I will send notes to Dr. Harriet Masson for upcoming appt. Will send FYI to requesting office pt has appt 12/14/20.

## 2020-12-03 DIAGNOSIS — T884XXA Failed or difficult intubation, initial encounter: Secondary | ICD-10-CM | POA: Insufficient documentation

## 2020-12-03 DIAGNOSIS — M109 Gout, unspecified: Secondary | ICD-10-CM | POA: Insufficient documentation

## 2020-12-14 ENCOUNTER — Ambulatory Visit (INDEPENDENT_AMBULATORY_CARE_PROVIDER_SITE_OTHER): Payer: Medicare Other | Admitting: Cardiology

## 2020-12-14 ENCOUNTER — Encounter: Payer: Self-pay | Admitting: Cardiology

## 2020-12-14 ENCOUNTER — Other Ambulatory Visit: Payer: Self-pay

## 2020-12-14 VITALS — BP 114/62 | HR 68 | Ht 70.0 in | Wt 227.1 lb

## 2020-12-14 DIAGNOSIS — I739 Peripheral vascular disease, unspecified: Secondary | ICD-10-CM

## 2020-12-14 DIAGNOSIS — I440 Atrioventricular block, first degree: Secondary | ICD-10-CM | POA: Insufficient documentation

## 2020-12-14 DIAGNOSIS — I10 Essential (primary) hypertension: Secondary | ICD-10-CM | POA: Diagnosis not present

## 2020-12-14 DIAGNOSIS — I25119 Atherosclerotic heart disease of native coronary artery with unspecified angina pectoris: Secondary | ICD-10-CM

## 2020-12-14 DIAGNOSIS — E782 Mixed hyperlipidemia: Secondary | ICD-10-CM

## 2020-12-14 NOTE — Patient Instructions (Signed)
Medication Instructions:  Your physician has recommended you make the following change in your medication:  Please hold the Plavix for 7 days prior to epidural injection Restart as soon as possible at the discretion of the physician administering the epi injection.   *If you need a refill on your cardiac medications before your next appointment, please call your pharmacy*   Lab Work: NONE If you have labs (blood work) drawn today and your tests are completely normal, you will receive your results only by: [place] (if you have MyChart) OR A paper copy in the mail If you have any lab test that is abnormal or we need to change your treatment, we will call you to review the results.   Testing/Procedures: EKG   Follow-Up: At [facility], you and your health needs are our priority.  As part of our continuing mission to provide you with exceptional heart care, we have created designated Provider Care Teams.  These Care Teams include your primary Cardiologist (physician) and Advanced Practice Providers (APPs -  Physician Assistants and Nurse Practitioners) who all work together to provide you with the care you need, when you need it.  We recommend signing up for the patient portal called "MyChart".  Sign up information is provided on this After Visit Summary.  MyChart is used to connect with patients for Virtual Visits (Telemedicine).  Patients are able to view lab/test results, encounter notes, upcoming appointments, etc.  Non-urgent messages can be sent to your provider as well.   To learn more about what you can do with MyChart, go to [email or web address].    Your next appointment:   6 month(s)  The format for your next appointment:   In Person  Provider:   Shirlee More, MD   Other Instructions

## 2020-12-14 NOTE — Progress Notes (Addendum)
Cardiology Office Note:    Date:  12/14/2020   ID:  [name], DOB 07-01-1945, MRN [id number]  PCP:  Townsend Roger, MD  Cardiologist:  Shirlee More, MD  Electrophysiologist:  None   Referring MD: No ref. provider found   I am planning for epidural surgery  History of Present Illness:    Daniel Adkins is a 75 y.o. male with a hx of coronary artery disease with PCI to the OM in 2014, PCI  2015 to the left circumflex and OM1, PAD, PCI to the LAD in August 2017, hypertension, hyperlipidemia now on PCSK9 inhibitors and morbid obesity.  The patient follows with Dr. Bettina Gavia and was last seen by him in March 2021 at that time he appeared to be stable from a cardiovascular standpoint.  No changes were made to his medication regimen.  His visit today is prompted preoperatively as the patient is planning epidural injection for his back pain.  He tells me this will be his fourth injection.  He denies any chest pain, any shortness of breath, any lightheadedness or dizziness.  He is planning a hunting trip to [place] soon.  December 20, 2020: Received the patient echocardiogram result which was done on Oct 07, 2020 his EF is 60 to 65% with grade 1 diastolic dysfunction.  Right ventricle was moderately enlarged.  Left atrium normal.  Aortic valve was described as bicuspid with [place] Ala E and moderate aortic stenosis mean gradient 22, AVA by VTI 1.1 cm, DI 0.33.  Mild mitral annular calcification.  Mild mitral regurgitation present.  Past Medical History:  Diagnosis Date   Arthritis 07/04/2017   CAD (coronary artery disease)    cath 2002- med treatment   Chronic maxillary sinusitis 04/23/2019   Coronary artery disease involving native coronary artery of native heart with angina pectoris ([place]) 10/18/2012   Coronary artery disease    Difficult intubation    "I have a short neck" (10/30/2012)   Gout    Hyperlipidemia    Hypertension    Hypertensive disorder 07/04/2017   hypertension   Obstructive  sleep apnea 09/18/2011   Overview:  CPAP   OSA on CPAP    PAD (peripheral artery disease) (HCC)    Peripheral arterial occlusive disease ([place]) 10/18/2012   Overview:  Overview:  Peripheral Vascular disease  Last Assessment & Plan:  History of peripheral vascular disease status post diamondback orbital rotational atherectomy, PTCA and stenting of a highly calcified distal left SFA 10/30/12. He did have 2 vessel runoff bilaterally with moderate right SFA disease as well. He was complaining of some atypical left thigh pain. Lower extremity Dopplers perfo   Peripheral vascular disease with claudication ([place]) 10/18/2012   Peripheral Vascular disease    Right ear impacted cerumen 10/16/2018    Past Surgical History:  Procedure Laterality Date   ATHERECTOMY N/A 10/30/2012   Procedure: ATHERECTOMY;  Surgeon: Lorretta Harp, MD;  Location: [facility] CATH LAB;  Service: Cardiovascular;  Laterality: N/A;   CARDIAC CATHETERIZATION  2002   medical treatment (done at [facility])   [place]  10/30/2012   "1" (10/30/2012)   KNEE ARTHROSCOPY W/ MENISCAL REPAIR Left ~ 2008   Stent to Femoral Artery     TONSILLECTOMY  1950's   "as a kid" (10/30/2012)    Current Medications: Current Meds  Medication Sig   aspirin EC 81 MG tablet Take 81 mg by mouth daily.  clopidogrel (PLAVIX) 75 MG tablet Take 1 tablet (75 mg total) by mouth daily with breakfast.   Evolocumab 140 MG/ML SOAJ Inject 560 mg into the skin every 30 (thirty) days. 140 mg 3 times per month   folic acid (FOLVITE) 1 MG tablet Take 1 tablet by mouth daily.   lisinopril (PRINIVIL,ZESTRIL) 20 MG tablet Take 20 mg by mouth daily. Take 0.5 tablet ( 10 mg ) tablet daily   Methotrexate, PF, 25 MG/0.5ML SOAJ Inject into the skin once a week.   Omega-3 Fatty Acids (FISH OIL) 1200 MG CAPS Take 1 capsule by mouth daily.   omeprazole (PRILOSEC) 40 MG capsule Take 40 mg by mouth daily.      Allergies:   Cephalosporins, Aspirin, Rosuvastatin, and Sulfa antibiotics   Social History   Socioeconomic History   Marital status: Single    Spouse name: Not on file   Number of children: Not on file   Years of education: Not on file   Highest education level: Not on file  Occupational History   Not on file  Tobacco Use   Smoking status: Former    Types: Cigars    Quit date: 10/19/1995    Years since quitting: 25.1   Smokeless tobacco: Never   Tobacco comments:    10/30/2012 "last smoked 5 swisher sweets/day when I quit smoking"  Vaping Use   Vaping Use: Never used  Substance and Sexual Activity   Alcohol use: Yes    Comment: 10/30/2012 "mixed drink usually once/month; sometimes twice"   Drug use: No   Sexual activity: Yes  Other Topics Concern   Not on file  Social History Narrative   Not on file   Social Determinants of Health   Financial Resource Strain: Not on file  Food Insecurity: Not on file  Transportation Needs: Not on file  Physical Activity: Not on file  Stress: Not on file  Social Connections: Not on file     Family History: The patient's family history includes Cancer in his sister; Coronary artery disease in his father; Heart attack in his father.  ROS:   Review of Systems  Constitution: Negative for decreased appetite, fever and weight gain.  HENT: Negative for congestion, ear discharge, hoarse voice and sore throat.   Eyes: Negative for discharge, redness, vision loss in right eye and visual halos.  Cardiovascular: Negative for chest pain, dyspnea on exertion, leg swelling, orthopnea and palpitations.  Respiratory: Negative for cough, hemoptysis, shortness of breath and snoring.   Endocrine: Negative for heat intolerance and polyphagia.  Hematologic/Lymphatic: Negative for bleeding problem. Does not bruise/bleed easily.  Skin: Negative for flushing, nail changes, rash and suspicious lesions.  Musculoskeletal: Negative for arthritis, joint  pain, muscle cramps, myalgias, neck pain and stiffness.  Gastrointestinal: Negative for abdominal pain, bowel incontinence, diarrhea and excessive appetite.  Genitourinary: Negative for decreased libido, genital sores and incomplete emptying.  Neurological: Negative for brief paralysis, focal weakness, headaches and loss of balance.  Psychiatric/Behavioral: Negative for altered mental status, depression and suicidal ideas.  Allergic/Immunologic: Negative for HIV exposure and persistent infections.    EKGs/Labs/Other Studies Reviewed:    The following studies were reviewed today:   EKG:  The ekg ordered today demonstrates sinus rhythm, heart rate 68 bpm with first-degree AV block.  Recent Labs: No results found for requested labs within last 8760 hours.  Recent Lipid Panel No results found for: CHOL, TRIG, HDL, CHOLHDL, VLDL, LDLCALC, LDLDIRECT  Physical Exam:    VS:  BP  114/62 (BP Location: Right Arm, Patient Position: Sitting)   Pulse 68   Ht '5\' 10"'$  (1.778 m)   Wt 227 lb 1.9 oz (103 kg)   SpO2 97%   BMI 32.59 kg/m     Wt Readings from Last 3 Encounters:  12/14/20 227 lb 1.9 oz (103 kg)  08/07/19 224 lb (101.6 kg)  02/06/18 234 lb (106.1 kg)     GEN: Well nourished, well developed in no acute distress HEENT: Normal NECK: No JVD; No carotid bruits LYMPHATICS: No lymphadenopathy CARDIAC: S1S2 noted,RRR, 3/6 mid systolic ejection murmurs, rubs, gallops RESPIRATORY:  Clear to auscultation without rales, wheezing or rhonchi  ABDOMEN: Soft, non-tender, non-distended, +bowel sounds, no guarding. EXTREMITIES: No edema, No cyanosis, no clubbing MUSCULOSKELETAL:  No deformity  SKIN: Warm and dry NEUROLOGIC:  Alert and oriented x 3, non-focal PSYCHIATRIC:  Normal affect, good insight  ASSESSMENT:    1. Peripheral vascular disease with claudication ([place])   2. Coronary artery disease involving native coronary artery of native heart with angina pectoris ([place])   3. Essential  hypertension   4. Primary hypertension   5. Mixed hyperlipidemia   6. Morbid obesity ([place])    PLAN:      No angina symptoms.  We will continue patient on his current medication regimen.  He does have an upcoming epidural injection for his low back pain.  He is currently on dual antiplatelet therapy with aspirin and Plavix.  The patient can hold his Plavix for 7 days prior to his procedure.  He can restart his Plavix as soon as possible at the discretion of the provider performing the epidural injection.  He expresses understanding on instructions.  Blood pressure is acceptable, continue with current antihypertensive regimen.  Hyperlipidemia - continue with current lipid-lowering agent.  The patient understands the need to lose weight with diet and exercise. We have discussed specific strategies for this.   The patient is in agreement with the above plan. The patient left the office in stable condition.  The patient will follow up in 6 months with Dr. Bettina Gavia   Medication Adjustments/Labs and Tests Ordered: Current medicines are reviewed at length with the patient today.  Concerns regarding medicines are outlined above.  Orders Placed This Encounter  Procedures   EKG 12-Lead   No orders of the defined types were placed in this encounter.   Patient Instructions  Medication Instructions:  Your physician has recommended you make the following change in your medication:  Please hold the Plavix for 7 days prior to epidural injection Restart as soon as possible at the discretion of the physician administering the epi injection.   *If you need a refill on your cardiac medications before your next appointment, please call your pharmacy*   Lab Work: NONE If you have labs (blood work) drawn today and your tests are completely normal, you will receive your results only by: [place] (if you have MyChart) OR A paper copy in the mail If you have any lab test that is abnormal or we  need to change your treatment, we will call you to review the results.   Testing/Procedures: EKG   Follow-Up: At [facility], you and your health needs are our priority.  As part of our continuing mission to provide you with exceptional heart care, we have created designated Provider Care Teams.  These Care Teams include your primary Cardiologist (physician) and Advanced Practice Providers (APPs -  Physician Assistants and Nurse Practitioners) who all work together to  provide you with the care you need, when you need it.  We recommend signing up for the patient portal called "MyChart".  Sign up information is provided on this After Visit Summary.  MyChart is used to connect with patients for Virtual Visits (Telemedicine).  Patients are able to view lab/test results, encounter notes, upcoming appointments, etc.  Non-urgent messages can be sent to your provider as well.   To learn more about what you can do with MyChart, go to [email or web address].    Your next appointment:   6 month(s)  The format for your next appointment:   In Person  Provider:   Shirlee More, MD   Other Instructions     Adopting a Healthy Lifestyle.  Know what a healthy weight is for you (roughly BMI <25) and aim to maintain this   Aim for 7+ servings of fruits and vegetables daily   65-80+ fluid ounces of water or unsweet tea for healthy kidneys   Limit to max 1 drink of alcohol per day; avoid smoking/tobacco   Limit animal fats in diet for cholesterol and heart health - choose grass fed whenever available   Avoid highly processed foods, and foods high in saturated/trans fats   Aim for low stress - take time to unwind and care for your mental health   Aim for 150 min of moderate intensity exercise weekly for heart health, and weights twice weekly for bone health   Aim for 7-9 hours of sleep daily   When it comes to diets, agreement about the perfect plan isnt easy to find, even among the  experts. Experts at the [place] developed an idea known as the Healthy Eating Plate. Just imagine a plate divided into logical, healthy portions.   The emphasis is on diet quality:   Load up on vegetables and fruits - one-half of your plate: Aim for color and variety, and remember that potatoes dont count.   Go for whole grains - one-quarter of your plate: Whole wheat, barley, wheat berries, quinoa, oats, brown rice, and foods made with them. If you want pasta, go with whole wheat pasta.   Protein power - one-quarter of your plate: Fish, chicken, beans, and nuts are all healthy, versatile protein sources. Limit red meat.   The diet, however, does go beyond the plate, offering a few other suggestions.   Use healthy plant oils, such as olive, canola, soy, corn, sunflower and peanut. Check the labels, and avoid partially hydrogenated oil, which have unhealthy trans fats.   If youre thirsty, drink water. Coffee and tea are good in moderation, but skip sugary drinks and limit milk and dairy products to one or two daily servings.   The type of carbohydrate in the diet is more important than the amount. Some sources of carbohydrates, such as vegetables, fruits, whole grains, and beans-are healthier than others.   Finally, stay active  Signed, Berniece Salines, DO  12/14/2020 1:28 PM    [facility]

## 2020-12-23 ENCOUNTER — Telehealth: Payer: Self-pay

## 2020-12-23 NOTE — Telephone Encounter (Signed)
Clearance report faxed to [facility] at [phone]

## 2021-01-21 DIAGNOSIS — M48062 Spinal stenosis, lumbar region with neurogenic claudication: Secondary | ICD-10-CM | POA: Diagnosis not present

## 2021-05-24 DIAGNOSIS — E669 Obesity, unspecified: Secondary | ICD-10-CM | POA: Diagnosis not present

## 2021-05-24 DIAGNOSIS — E7800 Pure hypercholesterolemia, unspecified: Secondary | ICD-10-CM | POA: Diagnosis not present

## 2021-05-24 DIAGNOSIS — I2510 Atherosclerotic heart disease of native coronary artery without angina pectoris: Secondary | ICD-10-CM | POA: Diagnosis not present

## 2021-05-24 DIAGNOSIS — D751 Secondary polycythemia: Secondary | ICD-10-CM | POA: Diagnosis not present

## 2021-05-24 DIAGNOSIS — Z1331 Encounter for screening for depression: Secondary | ICD-10-CM | POA: Diagnosis not present

## 2021-05-24 DIAGNOSIS — I350 Nonrheumatic aortic (valve) stenosis: Secondary | ICD-10-CM | POA: Diagnosis not present

## 2021-05-24 DIAGNOSIS — I10 Essential (primary) hypertension: Secondary | ICD-10-CM | POA: Diagnosis not present

## 2021-05-24 DIAGNOSIS — R7303 Prediabetes: Secondary | ICD-10-CM | POA: Diagnosis not present

## 2021-05-24 DIAGNOSIS — Z6833 Body mass index (BMI) 33.0-33.9, adult: Secondary | ICD-10-CM | POA: Diagnosis not present

## 2021-05-24 DIAGNOSIS — M159 Polyosteoarthritis, unspecified: Secondary | ICD-10-CM | POA: Diagnosis not present

## 2021-05-24 DIAGNOSIS — I739 Peripheral vascular disease, unspecified: Secondary | ICD-10-CM | POA: Diagnosis not present

## 2021-05-24 DIAGNOSIS — E291 Testicular hypofunction: Secondary | ICD-10-CM | POA: Diagnosis not present

## 2021-05-24 DIAGNOSIS — Z0000 Encounter for general adult medical examination without abnormal findings: Secondary | ICD-10-CM | POA: Diagnosis not present

## 2021-06-04 DIAGNOSIS — I2510 Atherosclerotic heart disease of native coronary artery without angina pectoris: Secondary | ICD-10-CM | POA: Diagnosis not present

## 2021-06-04 DIAGNOSIS — R7303 Prediabetes: Secondary | ICD-10-CM | POA: Diagnosis not present

## 2021-06-04 DIAGNOSIS — I10 Essential (primary) hypertension: Secondary | ICD-10-CM | POA: Diagnosis not present

## 2021-06-04 DIAGNOSIS — M109 Gout, unspecified: Secondary | ICD-10-CM | POA: Diagnosis not present

## 2021-06-04 DIAGNOSIS — I350 Nonrheumatic aortic (valve) stenosis: Secondary | ICD-10-CM | POA: Diagnosis not present

## 2021-06-04 DIAGNOSIS — E785 Hyperlipidemia, unspecified: Secondary | ICD-10-CM | POA: Diagnosis not present

## 2021-07-15 ENCOUNTER — Other Ambulatory Visit (HOSPITAL_COMMUNITY): Payer: Self-pay | Admitting: Cardiovascular Disease

## 2021-07-15 DIAGNOSIS — I739 Peripheral vascular disease, unspecified: Secondary | ICD-10-CM

## 2021-07-20 ENCOUNTER — Ambulatory Visit (HOSPITAL_COMMUNITY)
Admission: RE | Admit: 2021-07-20 | Discharge: 2021-07-20 | Disposition: A | Discharge status: Home / Self Care | Payer: Medicare Other | Source: Ambulatory Visit | Attending: Cardiology | Admitting: Cardiology

## 2021-07-20 ENCOUNTER — Other Ambulatory Visit: Payer: Self-pay

## 2021-07-20 DIAGNOSIS — I739 Peripheral vascular disease, unspecified: Secondary | ICD-10-CM | POA: Diagnosis not present

## 2021-10-07 DIAGNOSIS — H90A31 Mixed conductive and sensorineural hearing loss, unilateral, right ear with restricted hearing on the contralateral side: Secondary | ICD-10-CM | POA: Diagnosis not present

## 2021-11-23 ENCOUNTER — Ambulatory Visit: Payer: Medicare Other | Admitting: Physician Assistant

## 2021-12-02 DIAGNOSIS — M48062 Spinal stenosis, lumbar region with neurogenic claudication: Secondary | ICD-10-CM | POA: Diagnosis not present

## 2021-12-02 DIAGNOSIS — Z6832 Body mass index (BMI) 32.0-32.9, adult: Secondary | ICD-10-CM | POA: Diagnosis not present

## 2021-12-06 ENCOUNTER — Ambulatory Visit: Payer: Medicare Other | Admitting: Cardiology

## 2021-12-07 DIAGNOSIS — N201 Calculus of ureter: Secondary | ICD-10-CM | POA: Diagnosis not present

## 2021-12-09 DIAGNOSIS — D225 Melanocytic nevi of trunk: Secondary | ICD-10-CM | POA: Diagnosis not present

## 2021-12-09 DIAGNOSIS — L578 Other skin changes due to chronic exposure to nonionizing radiation: Secondary | ICD-10-CM | POA: Diagnosis not present

## 2021-12-09 DIAGNOSIS — N132 Hydronephrosis with renal and ureteral calculous obstruction: Secondary | ICD-10-CM | POA: Diagnosis not present

## 2021-12-09 DIAGNOSIS — L538 Other specified erythematous conditions: Secondary | ICD-10-CM | POA: Diagnosis not present

## 2021-12-09 DIAGNOSIS — R319 Hematuria, unspecified: Secondary | ICD-10-CM | POA: Diagnosis not present

## 2021-12-09 DIAGNOSIS — L281 Prurigo nodularis: Secondary | ICD-10-CM | POA: Diagnosis not present

## 2021-12-09 DIAGNOSIS — L298 Other pruritus: Secondary | ICD-10-CM | POA: Diagnosis not present

## 2021-12-09 DIAGNOSIS — L570 Actinic keratosis: Secondary | ICD-10-CM | POA: Diagnosis not present

## 2021-12-09 DIAGNOSIS — L300 Nummular dermatitis: Secondary | ICD-10-CM | POA: Diagnosis not present

## 2021-12-09 DIAGNOSIS — L820 Inflamed seborrheic keratosis: Secondary | ICD-10-CM | POA: Diagnosis not present

## 2021-12-09 DIAGNOSIS — L821 Other seborrheic keratosis: Secondary | ICD-10-CM | POA: Diagnosis not present

## 2021-12-13 DIAGNOSIS — N201 Calculus of ureter: Secondary | ICD-10-CM | POA: Diagnosis not present

## 2021-12-14 DIAGNOSIS — Z7982 Long term (current) use of aspirin: Secondary | ICD-10-CM | POA: Diagnosis not present

## 2021-12-14 DIAGNOSIS — N201 Calculus of ureter: Secondary | ICD-10-CM | POA: Diagnosis not present

## 2021-12-14 DIAGNOSIS — Z7902 Long term (current) use of antithrombotics/antiplatelets: Secondary | ICD-10-CM | POA: Diagnosis not present

## 2021-12-14 DIAGNOSIS — R319 Hematuria, unspecified: Secondary | ICD-10-CM | POA: Diagnosis not present

## 2021-12-14 DIAGNOSIS — I2510 Atherosclerotic heart disease of native coronary artery without angina pectoris: Secondary | ICD-10-CM | POA: Diagnosis not present

## 2021-12-14 DIAGNOSIS — N135 Crossing vessel and stricture of ureter without hydronephrosis: Secondary | ICD-10-CM | POA: Diagnosis not present

## 2021-12-14 DIAGNOSIS — Z95828 Presence of other vascular implants and grafts: Secondary | ICD-10-CM | POA: Diagnosis not present

## 2021-12-14 DIAGNOSIS — E785 Hyperlipidemia, unspecified: Secondary | ICD-10-CM | POA: Diagnosis not present

## 2021-12-14 DIAGNOSIS — Z87442 Personal history of urinary calculi: Secondary | ICD-10-CM | POA: Diagnosis not present

## 2021-12-14 DIAGNOSIS — Z79899 Other long term (current) drug therapy: Secondary | ICD-10-CM | POA: Diagnosis not present

## 2021-12-14 DIAGNOSIS — I10 Essential (primary) hypertension: Secondary | ICD-10-CM | POA: Diagnosis not present

## 2021-12-14 DIAGNOSIS — Z955 Presence of coronary angioplasty implant and graft: Secondary | ICD-10-CM | POA: Diagnosis not present

## 2021-12-14 DIAGNOSIS — I739 Peripheral vascular disease, unspecified: Secondary | ICD-10-CM | POA: Diagnosis not present

## 2021-12-14 DIAGNOSIS — G4733 Obstructive sleep apnea (adult) (pediatric): Secondary | ICD-10-CM | POA: Diagnosis not present

## 2021-12-20 DIAGNOSIS — N201 Calculus of ureter: Secondary | ICD-10-CM | POA: Diagnosis not present

## 2021-12-30 DIAGNOSIS — M5126 Other intervertebral disc displacement, lumbar region: Secondary | ICD-10-CM | POA: Diagnosis not present

## 2021-12-30 DIAGNOSIS — M48062 Spinal stenosis, lumbar region with neurogenic claudication: Secondary | ICD-10-CM | POA: Diagnosis not present

## 2021-12-30 DIAGNOSIS — M48061 Spinal stenosis, lumbar region without neurogenic claudication: Secondary | ICD-10-CM | POA: Diagnosis not present

## 2021-12-30 DIAGNOSIS — Z6831 Body mass index (BMI) 31.0-31.9, adult: Secondary | ICD-10-CM | POA: Diagnosis not present

## 2022-01-20 DIAGNOSIS — M48062 Spinal stenosis, lumbar region with neurogenic claudication: Secondary | ICD-10-CM | POA: Diagnosis not present

## 2022-02-03 ENCOUNTER — Encounter: Payer: Self-pay | Admitting: Cardiology

## 2022-02-03 ENCOUNTER — Ambulatory Visit: Payer: Medicare Other | Attending: Cardiology | Admitting: Cardiology

## 2022-02-03 ENCOUNTER — Telehealth: Payer: Self-pay

## 2022-02-03 VITALS — BP 120/64 | HR 66 | Ht 70.0 in | Wt 215.4 lb

## 2022-02-03 DIAGNOSIS — E782 Mixed hyperlipidemia: Secondary | ICD-10-CM | POA: Insufficient documentation

## 2022-02-03 DIAGNOSIS — I10 Essential (primary) hypertension: Secondary | ICD-10-CM | POA: Diagnosis not present

## 2022-02-03 DIAGNOSIS — I25119 Atherosclerotic heart disease of native coronary artery with unspecified angina pectoris: Secondary | ICD-10-CM | POA: Diagnosis not present

## 2022-02-03 DIAGNOSIS — I480 Paroxysmal atrial fibrillation: Secondary | ICD-10-CM | POA: Diagnosis not present

## 2022-02-03 DIAGNOSIS — I739 Peripheral vascular disease, unspecified: Secondary | ICD-10-CM | POA: Insufficient documentation

## 2022-02-03 NOTE — Telephone Encounter (Signed)
Spoke with patient who stated that the doctor in [place] did not do an EKG that he only listened to his heart and said he had atrial fibrillation then ordered for him to have the echocardiogram done. Will make Dr Bettina Gavia aware

## 2022-02-03 NOTE — Telephone Encounter (Signed)
Left voicemail for patient to return call back that Dr Bettina Gavia needs to know when and where patient had the EKG that showed him having atrial fibrillation

## 2022-02-03 NOTE — Patient Instructions (Addendum)
Medication Instructions:  Your physician recommends that you continue on your current medications as directed. Please refer to the Current Medication list given to you today.  *If you need a refill on your cardiac medications before your next appointment, please call your pharmacy*   Lab Work: None If you have labs (blood work) drawn today and your tests are completely normal, you will receive your results only by: [place] (if you have MyChart) OR A paper copy in the mail If you have any lab test that is abnormal or we need to change your treatment, we will call you to review the results.   Testing/Procedures: Your physician has requested that you have an echocardiogram. Echocardiography is a painless test that uses sound waves to create images of your heart. It provides your doctor with information about the size and shape of your heart and how well your heart's chambers and valves are working. This procedure takes approximately one hour. There are no restrictions for this procedure.    Follow-Up: At [facility], you and your health needs are our priority.  As part of our continuing mission to provide you with exceptional heart care, we have created designated Provider Care Teams.  These Care Teams include your primary Cardiologist (physician) and Advanced Practice Providers (APPs -  Physician Assistants and Nurse Practitioners) who all work together to provide you with the care you need, when you need it.  We recommend signing up for the patient portal called "MyChart".  Sign up information is provided on this After Visit Summary.  MyChart is used to connect with patients for Virtual Visits (Telemedicine).  Patients are able to view lab/test results, encounter notes, upcoming appointments, etc.  Non-urgent messages can be sent to your provider as well.   To learn more about what you can do with MyChart, go to [email or web address].    Your next appointment:   6  month(s)  The format for your next appointment:   In Person  Provider:   Shirlee More, MD    Other Instructions  Dr. Bettina Gavia advises a mobile kardia device  Important Information About Sugar          KardiaMobile [email or web address]        FDA-cleared, clinical grade mobile EKG monitor: Daniel Adkins is the most clinically-validated mobile EKG used by the world's leading cardiac care medical professionals With Basic service, know instantly if your heart rhythm is normal or if atrial fibrillation is detected, and email the last single EKG recording to yourself or your doctor Premium service, available for purchase through the Kardia app for $9.99 per month or $99 per year, includes unlimited history and storage of your EKG recordings, a monthly EKG summary report to share with your doctor, along with the ability to track your blood pressure, activity and weight Includes one KardiaMobile phone clip FREE SHIPPING: Standard delivery 1-3 business days. Orders placed by 11:00am PST will ship that afternoon. Otherwise, will ship next business day. All orders ship via [employer] from [place], [place]

## 2022-02-03 NOTE — Progress Notes (Signed)
Cardiology Office Note:    Date:  02/03/2022   ID:  [name], DOB Sep 28, 1945, MRN [id number]  PCP:  Townsend Roger, MD  Cardiologist:  Shirlee More, MD    Referring MD: Townsend Roger, MD    ASSESSMENT:    1. Paroxysmal atrial fibrillation (HCC)   2. Coronary artery disease involving native coronary artery of native heart with angina pectoris ([place])   3. Essential hypertension   4. Mixed hyperlipidemia   5. PAD (peripheral artery disease) (HCC)    PLAN:    In order of problems listed above:  My office will reach out to get a copy of that EKG showing he had atrial fibrillation.  He is not having any symptomatology no way to know if he is having clinical recurrence and I asked him to either use a smart watch he declined or to use the wireless receiver and screen his heart rhythm at home.  He is on dual antiplatelet therapy which is an alternative for patients who cannot be anticoagulated however this is not optimal he does not want to take an oral anticoagulant he wants an opinion from Dr. Glennon Mac at [facility] and I told him that I thought unless it was done as part of a clinical trial he would not be a candidate for Watchman device at this time.  In the absence of clinical recurrence I would not put him on antiarrhythmic drug. He is done well with the ID he is on his dual antiplatelet therapy and effective lipid-lowering with PCSK9 inhibitor LDL is at target.  He is having no angina at this time I would not repeat an ischemia evaluation Well-controlled continue ACE inhibitor Stable PAD following previous percutaneous intervention   Next appointment: 9 months    Medication Adjustments/Labs and Tests Ordered: Current medicines are reviewed at length with the patient today.  Concerns regarding medicines are outlined above.  Orders Placed This Encounter  Procedures   Ambulatory referral to Cardiac Electrophysiology   EKG 12-Lead   ECHOCARDIOGRAM COMPLETE   No orders of the  defined types were placed in this encounter.   Chief Complaint  Patient presents with   Coronary Artery Disease   Hypertension   Hyperlipidemia   Aortic Stenosis    History of Present Illness:    Daniel Adkins is a 76 y.o. male with a hx of CAD hypertension hyperlipidemia and peripheral arterial disease.  He has a history of PCI of the first marginal branch in October 2014 PCI to left circumflex and first marginal branch June 2015 and PCI and stent to the left anterior descending coronary artery August 2017.  He is statin intolerant.  He was last seen 08/07/2019.  Compliance with diet, lifestyle and medications: Yes  He alerts me that he had paroxysmal atrial fibrillation documented by his PCP and was placed on dual antiplatelet therapy.  He was unaware clinically He is asking me if he could have a Watchman device and request to see Dr. Norm Salt at [facility]. I explained as an alternative in patients at risk for stroke who are unable to tolerate anticoagulation or high risk for bleeding complication. I asked him to use a smart watch he declined but he is going to go ahead and purchase the mobile Kardia device and again to screen his heart rhythm at home for subclinical recurrence He has had a significant weight loss and feels improved He is not having angina edema shortness of breath chest pain palpitations syncope or claudication  An echocardiogram performed 12/28/2020 [place] Normal LV function mild LVH RV dilation right atrium dilated the aortic valve was bicuspid with moderate stenosis mean gradient 22 mmHg Past Medical History:  Diagnosis Date   Arthritis 07/04/2017   CAD (coronary artery disease)    cath 2002- med treatment   Chronic maxillary sinusitis 04/23/2019   Coronary artery disease involving native coronary artery of native heart with angina pectoris ([place]) 10/18/2012   Coronary artery disease    Difficult intubation    "I have a short neck" (10/30/2012)   Gout     Hyperlipidemia    Hypertension    Hypertensive disorder 07/04/2017   hypertension   Obstructive sleep apnea 09/18/2011   Overview:  CPAP   OSA on CPAP    PAD (peripheral artery disease) (HCC)    Peripheral arterial occlusive disease ([place]) 10/18/2012   Overview:  Overview:  Peripheral Vascular disease  Last Assessment & Plan:  History of peripheral vascular disease status post diamondback orbital rotational atherectomy, PTCA and stenting of a highly calcified distal left SFA 10/30/12. He did have 2 vessel runoff bilaterally with moderate right SFA disease as well. He was complaining of some atypical left thigh pain. Lower extremity Dopplers perfo   Peripheral vascular disease with claudication ([place]) 10/18/2012   Peripheral Vascular disease    Right ear impacted cerumen 10/16/2018    Past Surgical History:  Procedure Laterality Date   ATHERECTOMY N/A 10/30/2012   Procedure: ATHERECTOMY;  Surgeon: Lorretta Harp, MD;  Location: [facility] CATH LAB;  Service: Cardiovascular;  Laterality: N/A;   CARDIAC CATHETERIZATION  2002   medical treatment (done at [facility])   [place]  10/30/2012   "1" (10/30/2012)   KNEE ARTHROSCOPY W/ MENISCAL REPAIR Left ~ 2008   Stent to Femoral Artery     TONSILLECTOMY  1950's   "as a kid" (10/30/2012)    Current Medications: Current Meds  Medication Sig   aspirin EC 81 MG tablet Take 81 mg by mouth daily.   clopidogrel (PLAVIX) 75 MG tablet Take 1 tablet (75 mg total) by mouth daily with breakfast.   Evolocumab 140 MG/ML SOAJ Inject 560 mg into the skin every 30 (thirty) days. 140 mg 3 times per month   folic acid (FOLVITE) 1 MG tablet Take 1 tablet by mouth daily.   lisinopril (PRINIVIL,ZESTRIL) 20 MG tablet Take 20 mg by mouth daily. Take 0.5 tablet ( 10 mg ) tablet daily   MAGNESIUM PO Take 1,000 mg by mouth daily at 12 noon.   MOUNJARO 10 MG/0.5ML Pen Inject 10 mg into the skin  once a week.   Omega-3 Fatty Acids (FISH OIL) 1200 MG CAPS Take 1 capsule by mouth daily.   omeprazole (PRILOSEC) 40 MG capsule Take 40 mg by mouth daily.     Allergies:   Cephalosporins, Aspirin, Rosuvastatin, Cephalexin, and Sulfa antibiotics   Social History   Socioeconomic History   Marital status: Single    Spouse name: Not on file   Number of children: Not on file   Years of education: Not on file   Highest education level: Not on file  Occupational History   Not on file  Tobacco Use   Smoking status: Former    Types: Cigars    Quit date: 10/19/1995    Years since quitting: 26.3    Passive exposure: Past   Smokeless tobacco: Never  Tobacco comments:    10/30/2012 "last smoked 5 swisher sweets/day when I quit smoking"  Vaping Use   Vaping Use: Never used  Substance and Sexual Activity   Alcohol use: Yes    Comment: 10/30/2012 "mixed drink usually once/month; sometimes twice"   Drug use: No   Sexual activity: Yes  Other Topics Concern   Not on file  Social History Narrative   Not on file   Social Determinants of Health   Financial Resource Strain: Not on file  Food Insecurity: Not on file  Transportation Needs: Not on file  Physical Activity: Not on file  Stress: Not on file  Social Connections: Not on file     Family History: The patient's family history includes Cancer in his sister; Coronary artery disease in his father; Heart attack in his father. ROS:   Please see the history of present illness.    All other systems reviewed and are negative.  EKGs/Labs/Other Studies Reviewed:    The following studies were reviewed today:  EKG:  EKG ordered today and personally reviewed.  The ekg ordered today demonstrates sinus rhythm first-degree AV block otherwise normal EKG  Recent Labs: 05/24/2021: Cholesterol 103 LDL 38 A1c 5.8% hemoglobin 16.9 creatinine 0.99 potassium 4.6  Physical Exam:    VS:  BP 120/64 (BP Location: Right Arm, Patient Position:  Sitting)   Pulse 66   Ht '5\' 10"'$  (1.778 m)   Wt 215 lb 6.4 oz (97.7 kg)   SpO2 97%   BMI 30.91 kg/m     Wt Readings from Last 3 Encounters:  02/03/22 215 lb 6.4 oz (97.7 kg)  12/14/20 227 lb 1.9 oz (103 kg)  08/07/19 224 lb (101.6 kg)     GEN:  Well nourished, well developed in no acute distress HEENT: Normal NECK: No JVD; No carotid bruits LYMPHATICS: No lymphadenopathy CARDIAC: Unimpressive grade 1/6 localized AAS murmur aortic area S2 is normal does not radiate to the carotids no aortic regurgitation RRR, no murmurs, rubs, gallops RESPIRATORY:  Clear to auscultation without rales, wheezing or rhonchi  ABDOMEN: Soft, non-tender, non-distended MUSCULOSKELETAL:  No edema; No deformity  SKIN: Warm and dry NEUROLOGIC:  Alert and oriented x 3 PSYCHIATRIC:  Normal affect    Signed, Shirlee More, MD  02/03/2022 11:39 AM    [place]

## 2022-02-08 ENCOUNTER — Other Ambulatory Visit: Payer: Self-pay

## 2022-02-08 DIAGNOSIS — I480 Paroxysmal atrial fibrillation: Secondary | ICD-10-CM

## 2022-02-09 NOTE — Telephone Encounter (Signed)
Patient is scheduled for monitor visit on 02/21/22.

## 2022-02-10 ENCOUNTER — Other Ambulatory Visit: Payer: Self-pay

## 2022-02-10 ENCOUNTER — Telehealth: Payer: Self-pay

## 2022-02-10 NOTE — Telephone Encounter (Signed)
Dr. Bettina Gavia asked me to cancel this patients appointment with Dr. Glennon Mac at [facility] because there is no record of him having A-fib. I called and canceled the patients appointment with Dr. Glennon Mac at [facility]. I also called the patient and informed him that the appointment was cancelled. The patient was very confused because he had been told that he did have A-fib by another doctor, per the patient. However we cannot find any documentation that he actually has A-fib. Patient asked why we were doing the zio heart monitor if he did not have A-fib. Dr. Bettina Gavia stated that if he did have episodes of A-fib then by wearing the zio heart monitor he would catch it and then be able to decide what to do at that point. Patient was agreeable with this plan and had no further questions at this time.

## 2022-02-16 DIAGNOSIS — Z23 Encounter for immunization: Secondary | ICD-10-CM | POA: Diagnosis not present

## 2022-02-18 ENCOUNTER — Ambulatory Visit: Payer: Medicare Other

## 2022-02-21 ENCOUNTER — Ambulatory Visit: Payer: Medicare Other | Attending: Cardiology

## 2022-02-21 DIAGNOSIS — I480 Paroxysmal atrial fibrillation: Secondary | ICD-10-CM | POA: Diagnosis not present

## 2022-02-23 ENCOUNTER — Ambulatory Visit: Payer: Medicare Other | Admitting: Cardiology

## 2022-03-15 DIAGNOSIS — Z23 Encounter for immunization: Secondary | ICD-10-CM | POA: Diagnosis not present

## 2022-03-17 DIAGNOSIS — I480 Paroxysmal atrial fibrillation: Secondary | ICD-10-CM | POA: Diagnosis not present

## 2022-03-22 ENCOUNTER — Telehealth: Payer: Self-pay

## 2022-03-22 MED ORDER — METOPROLOL SUCCINATE ER 25 MG PO TB24: 25.0000 mg | ORAL_TABLET | Freq: Every day | ORAL | 3 refills | Status: DC

## 2022-03-22 NOTE — Telephone Encounter (Signed)
-----   Message from Richardo Priest, MD sent at 03/22/2022  8:38 AM EST ----- The clinical question was whether he had atrial fibrillation.  There were no episodes of atrial fibrillation or flutter. There were brief episodes of SVT I think he should be taking a beta-blocker recommend Toprol-XL 25 mg daily. As we discussed had like him to wear a smart watch, he can use apple with apple Samsung with Samsung or Fitbit with any of the smart phones to screen for atrial fibrillation.

## 2022-04-05 ENCOUNTER — Telehealth: Payer: Self-pay | Admitting: Cardiology

## 2022-04-05 NOTE — Telephone Encounter (Signed)
Pt would like to know who cancelled his appt for 04/14/22.

## 2022-04-05 NOTE — Telephone Encounter (Signed)
Patient called stating that someone from Dr. Joya Gaskins office used his name to cancel an appt he had with Dr. Ermalinda Barrios office. He wants to know why someone used his name to cancel his appt. He called today to confirm his appt with Dr. Glennon Mac and they told him he had canceled his appt.

## 2022-04-06 NOTE — Telephone Encounter (Signed)
Called patient and he reported that he had an appointment with Norm Salt a physician at [facility] on 11/30 and someone had called up using his name and canceled his appointment. He recently called Dr. Eduard Roux office back and now has an appointment for January. He was concerned that someone from our office had called using his name to cancel his appointment. I explained that I would let my administrator know his concerns regarding someone canceling his appointments. I spoke to my administrator and she said that this office would not call Dr. Marisue Brooklyn office and cancel his appointment. The administrator said that there was no need to follow up further at this time.

## 2022-05-02 ENCOUNTER — Ambulatory Visit: Payer: Medicare Other

## 2022-05-02 DIAGNOSIS — R7303 Prediabetes: Secondary | ICD-10-CM | POA: Diagnosis not present

## 2022-05-02 LAB — HEMOGLOBIN A1C
Est. average glucose Bld gHb Est-mCnc: 111 mg/dL
Hgb A1c MFr Bld: 5.5 % (ref 4.8–5.6)

## 2022-05-02 NOTE — Telephone Encounter (Signed)
This encounter was created in error - please disregard.

## 2022-06-03 ENCOUNTER — Telehealth: Payer: Self-pay | Admitting: Cardiology

## 2022-06-03 ENCOUNTER — Encounter: Payer: Self-pay | Admitting: Internal Medicine

## 2022-06-03 ENCOUNTER — Ambulatory Visit: Payer: Medicare PPO | Admitting: Internal Medicine

## 2022-06-03 VITALS — BP 118/60 | HR 72 | Temp 97.9°F | Resp 16 | Ht 70.0 in | Wt 208.0 lb

## 2022-06-03 DIAGNOSIS — E7800 Pure hypercholesterolemia, unspecified: Secondary | ICD-10-CM | POA: Diagnosis not present

## 2022-06-03 DIAGNOSIS — E291 Testicular hypofunction: Secondary | ICD-10-CM

## 2022-06-03 DIAGNOSIS — Z6829 Body mass index (BMI) 29.0-29.9, adult: Secondary | ICD-10-CM | POA: Diagnosis not present

## 2022-06-03 DIAGNOSIS — D751 Secondary polycythemia: Secondary | ICD-10-CM

## 2022-06-03 DIAGNOSIS — G4733 Obstructive sleep apnea (adult) (pediatric): Secondary | ICD-10-CM

## 2022-06-03 DIAGNOSIS — I25119 Atherosclerotic heart disease of native coronary artery with unspecified angina pectoris: Secondary | ICD-10-CM

## 2022-06-03 DIAGNOSIS — I2510 Atherosclerotic heart disease of native coronary artery without angina pectoris: Secondary | ICD-10-CM | POA: Insufficient documentation

## 2022-06-03 DIAGNOSIS — M48061 Spinal stenosis, lumbar region without neurogenic claudication: Secondary | ICD-10-CM

## 2022-06-03 DIAGNOSIS — R7303 Prediabetes: Secondary | ICD-10-CM

## 2022-06-03 DIAGNOSIS — Z0000 Encounter for general adult medical examination without abnormal findings: Secondary | ICD-10-CM | POA: Diagnosis not present

## 2022-06-03 DIAGNOSIS — I10 Essential (primary) hypertension: Secondary | ICD-10-CM

## 2022-06-03 DIAGNOSIS — Q231 Congenital insufficiency of aortic valve: Secondary | ICD-10-CM

## 2022-06-03 DIAGNOSIS — N400 Enlarged prostate without lower urinary tract symptoms: Secondary | ICD-10-CM

## 2022-06-03 DIAGNOSIS — I739 Peripheral vascular disease, unspecified: Secondary | ICD-10-CM

## 2022-06-03 MED ORDER — FOLIC ACID 1 MG PO TABS: 1.0000 mg | ORAL_TABLET | Freq: Every day | ORAL | 3 refills | Status: DC

## 2022-06-03 MED ORDER — TADALAFIL 20 MG PO TABS: 20.0000 mg | ORAL_TABLET | Freq: Every day | ORAL | 0 refills | Status: AC | PRN

## 2022-06-03 MED ORDER — CLOPIDOGREL BISULFATE 75 MG PO TABS: 75.0000 mg | ORAL_TABLET | Freq: Every day | ORAL | 3 refills | Status: DC

## 2022-06-03 MED ORDER — OMEPRAZOLE 40 MG PO CPDR: 40.0000 mg | DELAYED_RELEASE_CAPSULE | Freq: Every day | ORAL | 3 refills | Status: DC

## 2022-06-03 MED ORDER — LISINOPRIL 5 MG PO TABS: 5.0000 mg | ORAL_TABLET | Freq: Every day | ORAL | 3 refills | Status: DC

## 2022-06-03 NOTE — Assessment & Plan Note (Signed)
We will recheck his HgB levels and platelets levels today.

## 2022-06-03 NOTE — Assessment & Plan Note (Signed)
His BP is controlled today on his lisinopril.

## 2022-06-03 NOTE — Assessment & Plan Note (Signed)
The patient has a bicuspid valve and will need an ECHO in a few years.

## 2022-06-03 NOTE — Addendum Note (Signed)
Addended by: Townsend Roger on: 06/03/2022 09:37 AM   Modules accepted: Orders
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
## 2022-06-03 NOTE — Telephone Encounter (Signed)
Calling to get zio patching tracing. Fax [phone]. Please advise

## 2022-06-03 NOTE — Assessment & Plan Note (Signed)
He has a history of PVD where he had PTCA and stent of his left SFA in 2014.  He goes yearly for a LE arterial duplex with Dr. Gwenlyn Found.  Continue risk factor modification and his antiplatelet therapy.

## 2022-06-03 NOTE — Telephone Encounter (Signed)
Called and spoke to the nurse at [facility] and informed him that the zio patch tracing for Daniel Adkins, had been faxed over to his office. The nurse was appreciative and had no further questions at this time.

## 2022-06-03 NOTE — Assessment & Plan Note (Signed)
He remains on a PSK9 inhibitor where he has a history of CAD, PAD and prediabetes.  His goal LDL <70 but I would like it to be lower than that.

## 2022-06-03 NOTE — Assessment & Plan Note (Signed)
He is compliant with his CPAP for OSA.

## 2022-06-03 NOTE — Assessment & Plan Note (Signed)
I want him to eat healthy, exercise, continue mounjaro if able and continue to lose weight.

## 2022-06-03 NOTE — Assessment & Plan Note (Signed)
He can use tylenol as needed for any back pain.

## 2022-06-03 NOTE — Assessment & Plan Note (Signed)
His HgBA1c was done last month and this was controlled.

## 2022-06-03 NOTE — Assessment & Plan Note (Addendum)
He has a history of CAD.  We will continue risk factor modificaton and he is on a PSK9 inhibitor as well as ASA and plavix.  There is some question whether he has paroxsmal A. Fib.  He was told by cardiology he does not have this.  His notes were reviewed.

## 2022-06-03 NOTE — Assessment & Plan Note (Signed)
He was on testosterone in the past but we will not be restarting that.

## 2022-06-04 LAB — CMP14 + ANION GAP
ALT: 19 IU/L (ref 0–44)
AST: 25 IU/L (ref 0–40)
Albumin/Globulin Ratio: 1.4 (ref 1.2–2.2)
Albumin: 3.9 g/dL (ref 3.8–4.8)
Alkaline Phosphatase: 107 IU/L (ref 44–121)
Anion Gap: 12 mmol/L (ref 10.0–18.0)
BUN/Creatinine Ratio: 16 (ref 10–24)
BUN: 16 mg/dL (ref 8–27)
Bilirubin Total: 0.5 mg/dL (ref 0.0–1.2)
CO2: 22 mmol/L (ref 20–29)
Calcium: 8.7 mg/dL (ref 8.6–10.2)
Chloride: 104 mmol/L (ref 96–106)
Creatinine, Ser: 0.99 mg/dL (ref 0.76–1.27)
Globulin, Total: 2.7 g/dL (ref 1.5–4.5)
Glucose: 101 mg/dL — ABNORMAL HIGH (ref 70–99)
Potassium: 4.7 mmol/L (ref 3.5–5.2)
Sodium: 138 mmol/L (ref 134–144)
Total Protein: 6.6 g/dL (ref 6.0–8.5)
eGFR: 78 mL/min/{1.73_m2} (ref >59)

## 2022-06-04 LAB — CBC WITH DIFFERENTIAL/PLATELET
Basophils Absolute: 0 10*3/uL (ref 0.0–0.2)
Basos: 1 %
EOS (ABSOLUTE): 0.2 10*3/uL (ref 0.0–0.4)
Eos: 3 %
Hematocrit: 47 % (ref 37.5–51.0)
Hemoglobin: 16 g/dL (ref 13.0–17.7)
Immature Grans (Abs): 0 10*3/uL (ref 0.0–0.1)
Immature Granulocytes: 0 %
Lymphocytes Absolute: 1.4 10*3/uL (ref 0.7–3.1)
Lymphs: 21 %
MCH: 31.4 pg (ref 26.6–33.0)
MCHC: 34 g/dL (ref 31.5–35.7)
MCV: 92 fL (ref 79–97)
Monocytes Absolute: 0.4 10*3/uL (ref 0.1–0.9)
Monocytes: 6 %
Neutrophils Absolute: 4.4 10*3/uL (ref 1.4–7.0)
Neutrophils: 69 %
Platelets: 177 10*3/uL (ref 150–450)
RBC: 5.1 x10E6/uL (ref 4.14–5.80)
RDW: 13.1 % (ref 11.6–15.4)
WBC: 6.4 10*3/uL (ref 3.4–10.8)

## 2022-06-04 LAB — LIPID PANEL
Chol/HDL Ratio: 2.2 ratio (ref 0.0–5.0)
Cholesterol, Total: 99 mg/dL — ABNORMAL LOW (ref 100–199)
HDL: 45 mg/dL (ref >39)
LDL Chol Calc (NIH): 33 mg/dL (ref 0–99)
Triglycerides: 119 mg/dL (ref 0–149)
VLDL Cholesterol Cal: 21 mg/dL (ref 5–40)

## 2022-06-04 LAB — PSA: Prostate Specific Ag, Serum: 0.5 ng/mL (ref 0.0–4.0)

## 2022-06-04 LAB — TSH: TSH: 2.44 u[IU]/mL (ref 0.450–4.500)

## 2022-06-13 ENCOUNTER — Telehealth: Payer: Self-pay

## 2022-06-13 NOTE — Telephone Encounter (Signed)
-----  Message from Townsend Roger, MD sent at 06/12/2022  7:37 PM EST ----- Her labs look good.

## 2022-06-13 NOTE — Telephone Encounter (Signed)
Pt notified of labs

## 2022-07-28 ENCOUNTER — Telehealth: Payer: Self-pay

## 2022-07-28 NOTE — Telephone Encounter (Signed)
   Name: Tayo Maute  DOB: 1946-02-27  MRN: [id number]  Primary Cardiologist: Shirlee More, MD  Chart reviewed as part of pre-operative protocol coverage. The patient has an upcoming visit scheduled with Dr. Bettina Gavia on 08/08/2022 at which time clearance can be addressed in case there are any issues that would impact surgical recommendations.  ESI is not scheduled until 08/16/2022 as below. I added preop FYI to appointment note so that provider is aware to address at time of outpatient visit.  Per office protocol the cardiology provider should forward their finalized clearance decision and recommendations regarding antiplatelet therapy to the requesting party below.    I will route this message as FYI to requesting party and remove this message from the preop box as separate preop APP input not needed at this time.   Please call with any questions.  Lenna Sciara, NP  07/28/2022, 12:51 PM

## 2022-07-28 NOTE — Telephone Encounter (Signed)
   Pre-operative Risk Assessment    Patient Name: Daniel Adkins  DOB: [birth year] MRN: [id number]      Request for Surgical Clearance    Procedure:   Transforaminal - Left - L3 - L4 ESI  Date of Surgery:  Clearance 08/16/22                                 Surgeon:  Dr Lenord Carbo Surgeon's Group or Practice Name:  [facility] Phone number:  [phone] Fax number:  [phone]   Type of Clearance Requested:   - Pharmacy:  Hold Clopidogrel (Plavix) plavix   Type of Anesthesia:  Not Indicated   Additional requests/questions:  Please fax a copy of signed clearance form to the surgeon's office.  Signed, Toni Arthurs   07/28/2022, 12:40 PM

## 2022-08-03 NOTE — Telephone Encounter (Signed)
Pt called in stating he does not want to have a preop appt to be cleared for surgery. He asked me to cancel his appt and he will find another cardiologist.

## 2022-08-08 ENCOUNTER — Ambulatory Visit: Payer: Medicare PPO | Admitting: Cardiology

## 2022-08-16 DIAGNOSIS — M48062 Spinal stenosis, lumbar region with neurogenic claudication: Secondary | ICD-10-CM | POA: Diagnosis not present

## 2022-08-17 DIAGNOSIS — Z9622 Myringotomy tube(s) status: Secondary | ICD-10-CM | POA: Diagnosis not present

## 2022-08-17 DIAGNOSIS — H903 Sensorineural hearing loss, bilateral: Secondary | ICD-10-CM | POA: Diagnosis not present

## 2022-09-20 DIAGNOSIS — Z7189 Other specified counseling: Secondary | ICD-10-CM | POA: Diagnosis not present

## 2022-09-20 DIAGNOSIS — D0422 Carcinoma in situ of skin of left ear and external auricular canal: Secondary | ICD-10-CM | POA: Diagnosis not present

## 2022-11-03 DIAGNOSIS — G4733 Obstructive sleep apnea (adult) (pediatric): Secondary | ICD-10-CM | POA: Diagnosis not present

## 2022-12-05 ENCOUNTER — Other Ambulatory Visit: Payer: Self-pay | Admitting: Internal Medicine

## 2022-12-16 DIAGNOSIS — N200 Calculus of kidney: Secondary | ICD-10-CM | POA: Diagnosis not present

## 2022-12-16 DIAGNOSIS — Z87442 Personal history of urinary calculi: Secondary | ICD-10-CM | POA: Diagnosis not present

## 2023-02-14 DIAGNOSIS — H6691 Otitis media, unspecified, right ear: Secondary | ICD-10-CM | POA: Diagnosis not present

## 2023-02-21 DIAGNOSIS — H9211 Otorrhea, right ear: Secondary | ICD-10-CM | POA: Diagnosis not present

## 2023-02-21 DIAGNOSIS — Z9622 Myringotomy tube(s) status: Secondary | ICD-10-CM | POA: Diagnosis not present

## 2023-05-12 DIAGNOSIS — G4733 Obstructive sleep apnea (adult) (pediatric): Secondary | ICD-10-CM | POA: Diagnosis not present

## 2023-06-02 DIAGNOSIS — H6242 Otitis externa in other diseases classified elsewhere, left ear: Secondary | ICD-10-CM | POA: Diagnosis not present

## 2023-06-02 DIAGNOSIS — B369 Superficial mycosis, unspecified: Secondary | ICD-10-CM | POA: Diagnosis not present

## 2023-06-02 DIAGNOSIS — H9211 Otorrhea, right ear: Secondary | ICD-10-CM | POA: Diagnosis not present

## 2023-06-02 DIAGNOSIS — Z9622 Myringotomy tube(s) status: Secondary | ICD-10-CM | POA: Diagnosis not present

## 2023-06-09 ENCOUNTER — Ambulatory Visit: Payer: Medicare PPO | Admitting: Internal Medicine

## 2023-06-09 ENCOUNTER — Other Ambulatory Visit: Payer: Self-pay

## 2023-06-09 ENCOUNTER — Encounter: Payer: Self-pay | Admitting: Internal Medicine

## 2023-06-09 VITALS — BP 128/76 | HR 66 | Temp 98.5°F | Resp 17 | Ht 70.0 in | Wt 208.8 lb

## 2023-06-09 DIAGNOSIS — E785 Hyperlipidemia, unspecified: Secondary | ICD-10-CM | POA: Diagnosis not present

## 2023-06-09 DIAGNOSIS — I2510 Atherosclerotic heart disease of native coronary artery without angina pectoris: Secondary | ICD-10-CM | POA: Diagnosis not present

## 2023-06-09 DIAGNOSIS — Z0000 Encounter for general adult medical examination without abnormal findings: Secondary | ICD-10-CM | POA: Diagnosis not present

## 2023-06-09 DIAGNOSIS — Z6829 Body mass index (BMI) 29.0-29.9, adult: Secondary | ICD-10-CM

## 2023-06-09 DIAGNOSIS — E7800 Pure hypercholesterolemia, unspecified: Secondary | ICD-10-CM | POA: Diagnosis not present

## 2023-06-09 DIAGNOSIS — D751 Secondary polycythemia: Secondary | ICD-10-CM | POA: Diagnosis not present

## 2023-06-09 DIAGNOSIS — I10 Essential (primary) hypertension: Secondary | ICD-10-CM | POA: Diagnosis not present

## 2023-06-09 DIAGNOSIS — R7303 Prediabetes: Secondary | ICD-10-CM

## 2023-06-09 DIAGNOSIS — I739 Peripheral vascular disease, unspecified: Secondary | ICD-10-CM | POA: Diagnosis not present

## 2023-06-09 DIAGNOSIS — E291 Testicular hypofunction: Secondary | ICD-10-CM

## 2023-06-09 DIAGNOSIS — M48061 Spinal stenosis, lumbar region without neurogenic claudication: Secondary | ICD-10-CM

## 2023-06-09 DIAGNOSIS — I471 Supraventricular tachycardia, unspecified: Secondary | ICD-10-CM | POA: Insufficient documentation

## 2023-06-09 DIAGNOSIS — M150 Primary generalized (osteo)arthritis: Secondary | ICD-10-CM | POA: Insufficient documentation

## 2023-06-09 DIAGNOSIS — Q2381 Bicuspid aortic valve: Secondary | ICD-10-CM

## 2023-06-09 DIAGNOSIS — N400 Enlarged prostate without lower urinary tract symptoms: Secondary | ICD-10-CM

## 2023-06-09 DIAGNOSIS — G4733 Obstructive sleep apnea (adult) (pediatric): Secondary | ICD-10-CM

## 2023-06-09 MED ORDER — REPATHA SURECLICK 140 MG/ML ~~LOC~~ SOAJ: 140.0000 mg | SUBCUTANEOUS | 1 refills | Status: DC

## 2023-06-09 MED ORDER — OMEPRAZOLE 40 MG PO CPDR: 40.0000 mg | DELAYED_RELEASE_CAPSULE | Freq: Every day | ORAL | 3 refills | Status: DC

## 2023-06-09 MED ORDER — CLOPIDOGREL BISULFATE 75 MG PO TABS: 75.0000 mg | ORAL_TABLET | Freq: Every day | ORAL | 3 refills | Status: DC

## 2023-06-09 MED ORDER — FOLIC ACID 1 MG PO TABS: 1.0000 mg | ORAL_TABLET | Freq: Every day | ORAL | 3 refills | Status: DC

## 2023-06-09 MED ORDER — LISINOPRIL 5 MG PO TABS: 5.0000 mg | ORAL_TABLET | Freq: Every day | ORAL | 3 refills | Status: DC

## 2023-06-09 NOTE — Assessment & Plan Note (Signed)
We will continue on repatha at this time.  His LDL goal should be <70.

## 2023-06-09 NOTE — Assessment & Plan Note (Signed)
He does not have A. Fib.  He is not on metoprolol but the patient does not have any symptoms.

## 2023-06-09 NOTE — Assessment & Plan Note (Signed)
WE will check a PSA on him at this time.

## 2023-06-09 NOTE — Assessment & Plan Note (Signed)
He denies any angina at this time.  We will continue risk factor modification and ASA and plavix.

## 2023-06-09 NOTE — Assessment & Plan Note (Signed)
We will contact [facility] to try to switch his mask. He is compliant with his CPAP.
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
## 2023-06-09 NOTE — Assessment & Plan Note (Signed)
He does not want supplementation.

## 2023-06-09 NOTE — Progress Notes (Signed)
Rx Refill(s)

## 2023-06-09 NOTE — Assessment & Plan Note (Signed)
We will continue on healthy eating and exercising.  I want him to try to lose weight.

## 2023-06-09 NOTE — Assessment & Plan Note (Signed)
He needs an appointment with Dr. Allyson Sabal.  Continue risk factor modification and ASA and plavix.

## 2023-06-09 NOTE — Assessment & Plan Note (Signed)
We will check a HgBA1c on him at this time.

## 2023-06-09 NOTE — Assessment & Plan Note (Signed)
His BP is controlled.  We will continue on his current meds.

## 2023-06-09 NOTE — Assessment & Plan Note (Signed)
He can use tylenol as needed.

## 2023-06-09 NOTE — Assessment & Plan Note (Signed)
He will followup with neurosurgery as needed.

## 2023-06-10 LAB — CMP14 + ANION GAP
ALT: 30 [IU]/L (ref 0–44)
AST: 35 [IU]/L (ref 0–40)
Albumin: 4.3 g/dL (ref 3.8–4.8)
Alkaline Phosphatase: 125 [IU]/L — ABNORMAL HIGH (ref 44–121)
Anion Gap: 15 mmol/L (ref 10.0–18.0)
BUN/Creatinine Ratio: 13 (ref 10–24)
BUN: 13 mg/dL (ref 8–27)
Bilirubin Total: 0.9 mg/dL (ref 0.0–1.2)
CO2: 21 mmol/L (ref 20–29)
Calcium: 9.1 mg/dL (ref 8.6–10.2)
Chloride: 102 mmol/L (ref 96–106)
Creatinine, Ser: 1.01 mg/dL (ref 0.76–1.27)
Globulin, Total: 2.8 g/dL (ref 1.5–4.5)
Glucose: 93 mg/dL (ref 70–99)
Potassium: 4.7 mmol/L (ref 3.5–5.2)
Sodium: 138 mmol/L (ref 134–144)
Total Protein: 7.1 g/dL (ref 6.0–8.5)
eGFR: 76 mL/min/{1.73_m2} (ref >59)

## 2023-06-10 LAB — CBC WITH DIFFERENTIAL/PLATELET
Basophils Absolute: 0 10*3/uL (ref 0.0–0.2)
Basos: 1 %
EOS (ABSOLUTE): 0 10*3/uL (ref 0.0–0.4)
Eos: 1 %
Hematocrit: 49.8 % (ref 37.5–51.0)
Hemoglobin: 16.8 g/dL (ref 13.0–17.7)
Immature Grans (Abs): 0 10*3/uL (ref 0.0–0.1)
Immature Granulocytes: 0 %
Lymphocytes Absolute: 1.3 10*3/uL (ref 0.7–3.1)
Lymphs: 25 %
MCH: 32.3 pg (ref 26.6–33.0)
MCHC: 33.7 g/dL (ref 31.5–35.7)
MCV: 96 fL (ref 79–97)
Monocytes Absolute: 0.5 10*3/uL (ref 0.1–0.9)
Monocytes: 9 %
Neutrophils Absolute: 3.5 10*3/uL (ref 1.4–7.0)
Neutrophils: 64 %
Platelets: 157 10*3/uL (ref 150–450)
RBC: 5.2 x10E6/uL (ref 4.14–5.80)
RDW: 13.7 % (ref 11.6–15.4)
WBC: 5.3 10*3/uL (ref 3.4–10.8)

## 2023-06-10 LAB — LIPID PANEL
Chol/HDL Ratio: 2.5 {ratio} (ref 0.0–5.0)
Cholesterol, Total: 109 mg/dL (ref 100–199)
HDL: 44 mg/dL (ref >39)
LDL Chol Calc (NIH): 43 mg/dL (ref 0–99)
Triglycerides: 123 mg/dL (ref 0–149)
VLDL Cholesterol Cal: 22 mg/dL (ref 5–40)

## 2023-06-10 LAB — PSA: Prostate Specific Ag, Serum: 0.5 ng/mL (ref 0.0–4.0)

## 2023-06-10 LAB — HEMOGLOBIN A1C
Est. average glucose Bld gHb Est-mCnc: 120 mg/dL
Hgb A1c MFr Bld: 5.8 % — ABNORMAL HIGH (ref 4.8–5.6)

## 2023-06-10 LAB — TSH: TSH: 3.13 u[IU]/mL (ref 0.450–4.500)

## 2023-06-12 NOTE — Progress Notes (Signed)
Daniel Fat, MD  Christianne Dolin, CMA His labs are normal.  Pt. Informed of results. 06/12/23 09:47 a.m.

## 2023-06-14 ENCOUNTER — Telehealth: Payer: Self-pay | Admitting: Cardiovascular Disease

## 2023-06-14 DIAGNOSIS — I739 Peripheral vascular disease, unspecified: Secondary | ICD-10-CM

## 2023-06-14 NOTE — Telephone Encounter (Signed)
  Patient sees Dr Allyson Sabal for PV. He states that he usually gets an LEA  with ABI done before he is seen. Can orders be placed for testing. Patient is scheduled for an appt in March to give time to get is test scheduled.

## 2023-06-14 NOTE — Telephone Encounter (Signed)
Orders placed for LEA and ABI dopplers to be done prior to pt's office visit in March.

## 2023-06-15 DIAGNOSIS — G4733 Obstructive sleep apnea (adult) (pediatric): Secondary | ICD-10-CM | POA: Diagnosis not present

## 2023-06-15 DIAGNOSIS — Z9622 Myringotomy tube(s) status: Secondary | ICD-10-CM | POA: Diagnosis not present

## 2023-06-15 DIAGNOSIS — H6243 Otitis externa in other diseases classified elsewhere, bilateral: Secondary | ICD-10-CM | POA: Diagnosis not present

## 2023-06-15 DIAGNOSIS — B369 Superficial mycosis, unspecified: Secondary | ICD-10-CM | POA: Diagnosis not present

## 2023-06-20 DIAGNOSIS — Z961 Presence of intraocular lens: Secondary | ICD-10-CM | POA: Diagnosis not present

## 2023-06-28 DIAGNOSIS — Z9622 Myringotomy tube(s) status: Secondary | ICD-10-CM | POA: Diagnosis not present

## 2023-06-28 DIAGNOSIS — H6243 Otitis externa in other diseases classified elsewhere, bilateral: Secondary | ICD-10-CM | POA: Diagnosis not present

## 2023-06-28 DIAGNOSIS — M48062 Spinal stenosis, lumbar region with neurogenic claudication: Secondary | ICD-10-CM | POA: Diagnosis not present

## 2023-06-28 DIAGNOSIS — B369 Superficial mycosis, unspecified: Secondary | ICD-10-CM | POA: Diagnosis not present

## 2023-07-03 DIAGNOSIS — N200 Calculus of kidney: Secondary | ICD-10-CM | POA: Diagnosis not present

## 2023-07-10 ENCOUNTER — Ambulatory Visit (HOSPITAL_BASED_OUTPATIENT_CLINIC_OR_DEPARTMENT_OTHER)
Admission: RE | Admit: 2023-07-10 | Discharge: 2023-07-10 | Disposition: A | Discharge status: Home / Self Care | Payer: Medicare PPO | Source: Ambulatory Visit | Attending: Cardiovascular Disease | Admitting: Cardiovascular Disease

## 2023-07-10 ENCOUNTER — Ambulatory Visit (HOSPITAL_COMMUNITY)
Admission: RE | Admit: 2023-07-10 | Discharge: 2023-07-10 | Disposition: A | Discharge status: Home / Self Care | Payer: Medicare PPO | Source: Ambulatory Visit | Attending: Cardiovascular Disease | Admitting: Cardiovascular Disease

## 2023-07-10 DIAGNOSIS — I739 Peripheral vascular disease, unspecified: Secondary | ICD-10-CM | POA: Insufficient documentation

## 2023-07-10 LAB — VAS US ABI WITH/WO TBI
Left ABI: 0.95
Right ABI: 0.95

## 2023-07-17 DIAGNOSIS — M48062 Spinal stenosis, lumbar region with neurogenic claudication: Secondary | ICD-10-CM | POA: Diagnosis not present

## 2023-07-18 ENCOUNTER — Ambulatory Visit: Payer: Medicare PPO | Admitting: Cardiovascular Disease

## 2023-07-18 DIAGNOSIS — H6991 Unspecified Eustachian tube disorder, right ear: Secondary | ICD-10-CM | POA: Diagnosis not present

## 2023-07-18 DIAGNOSIS — H6243 Otitis externa in other diseases classified elsewhere, bilateral: Secondary | ICD-10-CM | POA: Diagnosis not present

## 2023-07-18 DIAGNOSIS — Z9622 Myringotomy tube(s) status: Secondary | ICD-10-CM | POA: Diagnosis not present

## 2023-07-18 DIAGNOSIS — B369 Superficial mycosis, unspecified: Secondary | ICD-10-CM | POA: Diagnosis not present

## 2023-08-31 DIAGNOSIS — I471 Supraventricular tachycardia, unspecified: Secondary | ICD-10-CM | POA: Diagnosis not present

## 2023-08-31 DIAGNOSIS — D696 Thrombocytopenia, unspecified: Secondary | ICD-10-CM | POA: Diagnosis not present

## 2023-08-31 DIAGNOSIS — I2510 Atherosclerotic heart disease of native coronary artery without angina pectoris: Secondary | ICD-10-CM | POA: Diagnosis not present

## 2023-08-31 DIAGNOSIS — I739 Peripheral vascular disease, unspecified: Secondary | ICD-10-CM | POA: Diagnosis not present

## 2023-08-31 DIAGNOSIS — I350 Nonrheumatic aortic (valve) stenosis: Secondary | ICD-10-CM | POA: Diagnosis not present

## 2023-08-31 DIAGNOSIS — G4733 Obstructive sleep apnea (adult) (pediatric): Secondary | ICD-10-CM | POA: Diagnosis not present

## 2023-08-31 DIAGNOSIS — I10 Essential (primary) hypertension: Secondary | ICD-10-CM | POA: Diagnosis not present

## 2023-08-31 DIAGNOSIS — I25118 Atherosclerotic heart disease of native coronary artery with other forms of angina pectoris: Secondary | ICD-10-CM | POA: Diagnosis not present

## 2023-08-31 DIAGNOSIS — R7303 Prediabetes: Secondary | ICD-10-CM | POA: Diagnosis not present

## 2023-09-06 ENCOUNTER — Ambulatory Visit: Admitting: Internal Medicine

## 2023-09-06 ENCOUNTER — Encounter: Payer: Self-pay | Admitting: Internal Medicine

## 2023-09-06 VITALS — BP 134/82 | HR 60 | Temp 98.7°F | Resp 16 | Ht 70.0 in | Wt 215.8 lb

## 2023-09-06 DIAGNOSIS — G4733 Obstructive sleep apnea (adult) (pediatric): Secondary | ICD-10-CM

## 2023-09-06 NOTE — Assessment & Plan Note (Signed)
 I recommend him to have an overnight home sleep study/oximetry.  We will arrange this thru [facility].

## 2023-09-06 NOTE — Progress Notes (Signed)
 Office Visit  Subjective   Patient ID: Daniel Adkins   DOB: [birth year]   Age: 78 y.o.   MRN: [id number]   Chief Complaint No chief complaint on file.    History of Present Illness Daniel Adkins comes in today for reevaluation of OSA.  He tried to get a new CPAP but he has not had a sleep study since 2016.  He requires a new sleep study.  He does have a history of OSA and still uses his CPAP at home which he has been compliant.  He feels well rested in the morning.  He does snore.     Past Medical History Past Medical History:  Diagnosis Date   Arthritis 07/04/2017   CAD (coronary artery disease)    cath 2002- med treatment   Chronic maxillary sinusitis 04/23/2019   Coronary artery disease involving native coronary artery of native heart with angina pectoris (HCC) 10/18/2012   Coronary artery disease    Difficult intubation    "I have a short neck" (10/30/2012)   Gout    Hyperlipidemia    Hypertension    Hypertensive disorder 07/04/2017   hypertension   Obstructive sleep apnea 09/18/2011   Overview:  CPAP   OSA on CPAP    PAD (peripheral artery disease) (HCC)    Peripheral arterial occlusive disease (HCC) 10/18/2012   Overview:  Overview:  Peripheral Vascular disease  Last Assessment & Plan:  History of peripheral vascular disease status post diamondback orbital rotational atherectomy, PTCA and stenting of a highly calcified distal left SFA 10/30/12. He did have 2 vessel runoff bilaterally with moderate right SFA disease as well. He was complaining of some atypical left thigh pain. Lower extremity Dopplers perfo   Peripheral vascular disease with claudication (HCC) 10/18/2012   Peripheral Vascular disease    Right ear impacted cerumen 10/16/2018     Allergies Allergies  Allergen Reactions   Cephalosporins Anaphylaxis and Hives    Other reaction(s): Hives   Aspirin      Other reaction(s): Other (See Comments) Excessive bleeding and bruising   Rosuvastatin     Other reaction(s): Other (See  Comments) Muscle aches Muscle aches    Cephalexin     Other reaction(s): Not available   Sulfa Antibiotics     Other reaction(s): Unknown     Medications  Current Outpatient Medications:    aspirin  EC 81 MG tablet, Take 81 mg by mouth daily., Disp: , Rfl:    clopidogrel  (PLAVIX ) 75 MG tablet, Take 1 tablet (75 mg total) by mouth daily with breakfast., Disp: 90 tablet, Rfl: 3   Evolocumab  (REPATHA  SURECLICK) 140 MG/ML SOAJ, Inject 140 mg into the skin every 14 (fourteen) days., Disp: 2 mL, Rfl: 1   folic acid  (FOLVITE ) 1 MG tablet, Take 1 tablet (1 mg total) by mouth daily., Disp: 90 tablet, Rfl: 3   lisinopril  (ZESTRIL ) 5 MG tablet, Take 1 tablet (5 mg total) by mouth daily. Take 0.5 tablet ( 10 mg ) tablet daily, Disp: 90 tablet, Rfl: 3   MAGNESIUM PO, Take 1,000 mg by mouth daily at 12 noon., Disp: , Rfl:    Omega-3 Fatty Acids (FISH OIL) 1200 MG CAPS, Take 1 capsule by mouth daily., Disp: , Rfl:    omeprazole  (PRILOSEC) 40 MG capsule, Take 1 capsule (40 mg total) by mouth daily., Disp: 90 capsule, Rfl: 3   tadalafil  (CIALIS ) 20 MG tablet, Take 1 tablet (20 mg total) by mouth daily as needed for erectile dysfunction., Disp: 100 tablet,  Rfl: 0   Review of Systems Review of Systems  Constitutional:  Negative for chills.  Respiratory:  Negative for cough and shortness of breath.   Cardiovascular:  Negative for chest pain, palpitations and leg swelling.  Gastrointestinal:  Negative for abdominal pain, constipation, diarrhea, nausea and vomiting.  Neurological:  Negative for dizziness, weakness and headaches.       Objective:    Vitals BP 134/82   Pulse 60   Temp 98.7 F (37.1 C)   Resp 16   Ht 5\' 10"  (1.778 m)   Wt 215 lb 12.8 oz (97.9 kg)   SpO2 97%   BMI 30.96 kg/m    Physical Examination Physical Exam Constitutional:      Appearance: Normal appearance. He is not ill-appearing.  Cardiovascular:     Rate and Rhythm: Normal rate and regular rhythm.     Pulses:  Normal pulses.     Heart sounds: No murmur heard.    No friction rub. No gallop.  Pulmonary:     Effort: Pulmonary effort is normal. No respiratory distress.     Breath sounds: No wheezing, rhonchi or rales.  Abdominal:     General: Abdomen is flat. Bowel sounds are normal. There is no distension.     Palpations: Abdomen is soft.     Tenderness: There is no abdominal tenderness.  Musculoskeletal:     Right lower leg: No edema.     Left lower leg: No edema.  Skin:    General: Skin is warm and dry.     Findings: No rash.  Neurological:     Mental Status: He is alert.        Assessment & Plan:   OSA (obstructive sleep apnea) I recommend him to have an overnight home sleep study/oximetry.  We will arrange this thru [facility].    No follow-ups on file.   Wayne Haines, MD

## 2023-09-07 DIAGNOSIS — H6991 Unspecified Eustachian tube disorder, right ear: Secondary | ICD-10-CM | POA: Diagnosis not present

## 2023-09-19 ENCOUNTER — Other Ambulatory Visit: Payer: Self-pay

## 2023-09-19 MED ORDER — REPATHA SURECLICK 140 MG/ML ~~LOC~~ SOAJ: 140.0000 mg | SUBCUTANEOUS | 1 refills | Status: DC

## 2023-09-19 MED ORDER — FOLIC ACID 1 MG PO TABS: 1.0000 mg | ORAL_TABLET | Freq: Every day | ORAL | 3 refills | Status: AC

## 2023-09-19 MED ORDER — OMEPRAZOLE 40 MG PO CPDR: 40.0000 mg | DELAYED_RELEASE_CAPSULE | Freq: Every day | ORAL | 3 refills | Status: DC

## 2023-09-19 MED ORDER — ASPIRIN EC 81 MG PO TBEC: 81.0000 mg | DELAYED_RELEASE_TABLET | Freq: Every day | ORAL | 2 refills | Status: AC

## 2023-09-19 MED ORDER — LISINOPRIL 5 MG PO TABS: 5.0000 mg | ORAL_TABLET | Freq: Every day | ORAL | 3 refills | Status: AC

## 2023-09-19 MED ORDER — CLOPIDOGREL BISULFATE 75 MG PO TABS: 75.0000 mg | ORAL_TABLET | Freq: Every day | ORAL | 3 refills | Status: AC

## 2023-09-19 MED ORDER — FISH OIL 1200 MG PO CAPS: 1.0000 | ORAL_CAPSULE | Freq: Every day | ORAL | 2 refills | Status: AC

## 2023-09-19 NOTE — Progress Notes (Signed)
 Rx refills

## 2023-09-25 DIAGNOSIS — G4733 Obstructive sleep apnea (adult) (pediatric): Secondary | ICD-10-CM | POA: Diagnosis not present

## 2023-10-10 DIAGNOSIS — G4733 Obstructive sleep apnea (adult) (pediatric): Secondary | ICD-10-CM | POA: Diagnosis not present

## 2023-10-18 DIAGNOSIS — M48062 Spinal stenosis, lumbar region with neurogenic claudication: Secondary | ICD-10-CM | POA: Diagnosis not present

## 2023-10-23 DIAGNOSIS — I25118 Atherosclerotic heart disease of native coronary artery with other forms of angina pectoris: Secondary | ICD-10-CM | POA: Diagnosis not present

## 2023-10-23 DIAGNOSIS — R42 Dizziness and giddiness: Secondary | ICD-10-CM | POA: Diagnosis not present

## 2023-10-23 DIAGNOSIS — I350 Nonrheumatic aortic (valve) stenosis: Secondary | ICD-10-CM | POA: Diagnosis not present

## 2023-10-23 DIAGNOSIS — I10 Essential (primary) hypertension: Secondary | ICD-10-CM | POA: Diagnosis not present

## 2023-10-23 DIAGNOSIS — R0609 Other forms of dyspnea: Secondary | ICD-10-CM | POA: Diagnosis not present

## 2023-10-30 ENCOUNTER — Other Ambulatory Visit: Payer: Self-pay | Admitting: Internal Medicine

## 2023-11-01 DIAGNOSIS — G4733 Obstructive sleep apnea (adult) (pediatric): Secondary | ICD-10-CM | POA: Diagnosis not present

## 2023-11-01 DIAGNOSIS — R7303 Prediabetes: Secondary | ICD-10-CM | POA: Diagnosis not present

## 2023-11-01 DIAGNOSIS — I2510 Atherosclerotic heart disease of native coronary artery without angina pectoris: Secondary | ICD-10-CM | POA: Diagnosis not present

## 2023-11-01 DIAGNOSIS — I350 Nonrheumatic aortic (valve) stenosis: Secondary | ICD-10-CM | POA: Diagnosis not present

## 2023-11-01 DIAGNOSIS — I471 Supraventricular tachycardia, unspecified: Secondary | ICD-10-CM | POA: Diagnosis not present

## 2023-11-01 DIAGNOSIS — I739 Peripheral vascular disease, unspecified: Secondary | ICD-10-CM | POA: Diagnosis not present

## 2023-11-01 DIAGNOSIS — D696 Thrombocytopenia, unspecified: Secondary | ICD-10-CM | POA: Diagnosis not present

## 2023-11-01 DIAGNOSIS — I10 Essential (primary) hypertension: Secondary | ICD-10-CM | POA: Diagnosis not present

## 2023-11-01 DIAGNOSIS — I25118 Atherosclerotic heart disease of native coronary artery with other forms of angina pectoris: Secondary | ICD-10-CM | POA: Diagnosis not present

## 2023-11-03 DIAGNOSIS — G4733 Obstructive sleep apnea (adult) (pediatric): Secondary | ICD-10-CM | POA: Diagnosis not present

## 2023-11-10 DIAGNOSIS — G4733 Obstructive sleep apnea (adult) (pediatric): Secondary | ICD-10-CM | POA: Diagnosis not present

## 2023-11-13 ENCOUNTER — Other Ambulatory Visit: Payer: Self-pay

## 2023-11-13 MED ORDER — OMEPRAZOLE 40 MG PO CPDR: 40.0000 mg | DELAYED_RELEASE_CAPSULE | Freq: Every day | ORAL | 3 refills | Status: AC

## 2023-11-13 NOTE — Progress Notes (Signed)
 Rx refill

## 2023-11-20 DIAGNOSIS — M5030 Other cervical disc degeneration, unspecified cervical region: Secondary | ICD-10-CM | POA: Diagnosis not present

## 2023-11-20 DIAGNOSIS — M5413 Radiculopathy, cervicothoracic region: Secondary | ICD-10-CM | POA: Diagnosis not present

## 2023-11-20 DIAGNOSIS — M5000 Cervical disc disorder with myelopathy, unspecified cervical region: Secondary | ICD-10-CM | POA: Diagnosis not present

## 2023-11-20 DIAGNOSIS — M609 Myositis, unspecified: Secondary | ICD-10-CM | POA: Diagnosis not present

## 2023-11-22 DIAGNOSIS — M5413 Radiculopathy, cervicothoracic region: Secondary | ICD-10-CM | POA: Diagnosis not present

## 2023-11-22 DIAGNOSIS — M5000 Cervical disc disorder with myelopathy, unspecified cervical region: Secondary | ICD-10-CM | POA: Diagnosis not present

## 2023-11-22 DIAGNOSIS — M609 Myositis, unspecified: Secondary | ICD-10-CM | POA: Diagnosis not present

## 2023-11-22 DIAGNOSIS — M5030 Other cervical disc degeneration, unspecified cervical region: Secondary | ICD-10-CM | POA: Diagnosis not present

## 2023-11-27 DIAGNOSIS — M609 Myositis, unspecified: Secondary | ICD-10-CM | POA: Diagnosis not present

## 2023-11-27 DIAGNOSIS — M5000 Cervical disc disorder with myelopathy, unspecified cervical region: Secondary | ICD-10-CM | POA: Diagnosis not present

## 2023-11-27 DIAGNOSIS — M5030 Other cervical disc degeneration, unspecified cervical region: Secondary | ICD-10-CM | POA: Diagnosis not present

## 2023-11-27 DIAGNOSIS — M5413 Radiculopathy, cervicothoracic region: Secondary | ICD-10-CM | POA: Diagnosis not present

## 2023-12-05 DIAGNOSIS — I2510 Atherosclerotic heart disease of native coronary artery without angina pectoris: Secondary | ICD-10-CM | POA: Diagnosis not present

## 2023-12-05 DIAGNOSIS — E782 Mixed hyperlipidemia: Secondary | ICD-10-CM | POA: Diagnosis not present

## 2023-12-05 DIAGNOSIS — I350 Nonrheumatic aortic (valve) stenosis: Secondary | ICD-10-CM | POA: Diagnosis not present

## 2023-12-10 DIAGNOSIS — G4733 Obstructive sleep apnea (adult) (pediatric): Secondary | ICD-10-CM | POA: Diagnosis not present

## 2023-12-21 DIAGNOSIS — G4733 Obstructive sleep apnea (adult) (pediatric): Secondary | ICD-10-CM | POA: Diagnosis not present

## 2023-12-25 DIAGNOSIS — I350 Nonrheumatic aortic (valve) stenosis: Secondary | ICD-10-CM | POA: Diagnosis not present

## 2023-12-26 ENCOUNTER — Other Ambulatory Visit: Payer: Self-pay

## 2023-12-26 MED ORDER — REPATHA SURECLICK 140 MG/ML ~~LOC~~ SOAJ: 140.0000 mg | SUBCUTANEOUS | 0 refills | Status: DC

## 2023-12-26 NOTE — Progress Notes (Signed)
 Rx refill

## 2024-01-03 DIAGNOSIS — Z95828 Presence of other vascular implants and grafts: Secondary | ICD-10-CM | POA: Diagnosis not present

## 2024-01-03 DIAGNOSIS — Z955 Presence of coronary angioplasty implant and graft: Secondary | ICD-10-CM | POA: Diagnosis not present

## 2024-01-03 DIAGNOSIS — Z881 Allergy status to other antibiotic agents status: Secondary | ICD-10-CM | POA: Diagnosis not present

## 2024-01-03 DIAGNOSIS — I25118 Atherosclerotic heart disease of native coronary artery with other forms of angina pectoris: Secondary | ICD-10-CM | POA: Diagnosis not present

## 2024-01-03 DIAGNOSIS — I350 Nonrheumatic aortic (valve) stenosis: Secondary | ICD-10-CM | POA: Diagnosis not present

## 2024-01-03 DIAGNOSIS — I70203 Unspecified atherosclerosis of native arteries of extremities, bilateral legs: Secondary | ICD-10-CM | POA: Diagnosis not present

## 2024-01-03 DIAGNOSIS — Q2381 Bicuspid aortic valve: Secondary | ICD-10-CM | POA: Diagnosis not present

## 2024-01-03 DIAGNOSIS — Z7982 Long term (current) use of aspirin: Secondary | ICD-10-CM | POA: Diagnosis not present

## 2024-01-03 DIAGNOSIS — E785 Hyperlipidemia, unspecified: Secondary | ICD-10-CM | POA: Diagnosis not present

## 2024-01-03 DIAGNOSIS — R0602 Shortness of breath: Secondary | ICD-10-CM | POA: Diagnosis not present

## 2024-01-03 DIAGNOSIS — I2510 Atherosclerotic heart disease of native coronary artery without angina pectoris: Secondary | ICD-10-CM | POA: Diagnosis not present

## 2024-01-03 DIAGNOSIS — Z01818 Encounter for other preprocedural examination: Secondary | ICD-10-CM | POA: Diagnosis not present

## 2024-01-03 DIAGNOSIS — Z951 Presence of aortocoronary bypass graft: Secondary | ICD-10-CM | POA: Diagnosis not present

## 2024-01-05 DIAGNOSIS — Z01818 Encounter for other preprocedural examination: Secondary | ICD-10-CM | POA: Diagnosis not present

## 2024-01-05 DIAGNOSIS — R9431 Abnormal electrocardiogram [ECG] [EKG]: Secondary | ICD-10-CM | POA: Diagnosis not present

## 2024-01-05 DIAGNOSIS — I2510 Atherosclerotic heart disease of native coronary artery without angina pectoris: Secondary | ICD-10-CM | POA: Diagnosis not present

## 2024-01-05 DIAGNOSIS — I440 Atrioventricular block, first degree: Secondary | ICD-10-CM | POA: Diagnosis not present

## 2024-01-09 DIAGNOSIS — Z01818 Encounter for other preprocedural examination: Secondary | ICD-10-CM | POA: Diagnosis not present

## 2024-01-09 DIAGNOSIS — Z951 Presence of aortocoronary bypass graft: Secondary | ICD-10-CM | POA: Diagnosis not present

## 2024-01-09 DIAGNOSIS — I2510 Atherosclerotic heart disease of native coronary artery without angina pectoris: Secondary | ICD-10-CM | POA: Diagnosis not present

## 2024-01-09 DIAGNOSIS — I351 Nonrheumatic aortic (valve) insufficiency: Secondary | ICD-10-CM | POA: Diagnosis not present

## 2024-01-10 DIAGNOSIS — G4733 Obstructive sleep apnea (adult) (pediatric): Secondary | ICD-10-CM | POA: Diagnosis not present

## 2024-01-17 DIAGNOSIS — I350 Nonrheumatic aortic (valve) stenosis: Secondary | ICD-10-CM | POA: Diagnosis not present

## 2024-01-20 DIAGNOSIS — I2510 Atherosclerotic heart disease of native coronary artery without angina pectoris: Secondary | ICD-10-CM | POA: Diagnosis not present

## 2024-01-20 DIAGNOSIS — Z01810 Encounter for preprocedural cardiovascular examination: Secondary | ICD-10-CM | POA: Diagnosis not present

## 2024-01-20 DIAGNOSIS — I350 Nonrheumatic aortic (valve) stenosis: Secondary | ICD-10-CM | POA: Diagnosis not present

## 2024-01-20 DIAGNOSIS — I3481 Nonrheumatic mitral (valve) annulus calcification: Secondary | ICD-10-CM | POA: Diagnosis not present

## 2024-01-25 DIAGNOSIS — Z23 Encounter for immunization: Secondary | ICD-10-CM | POA: Diagnosis not present

## 2024-02-02 ENCOUNTER — Telehealth: Admitting: Internal Medicine

## 2024-02-02 DIAGNOSIS — I25119 Atherosclerotic heart disease of native coronary artery with unspecified angina pectoris: Secondary | ICD-10-CM | POA: Diagnosis not present

## 2024-02-02 NOTE — Assessment & Plan Note (Signed)
 The patient states they cancelled his surgery and wanted to do a MRI to relook at his aortic valve.  He had that done weeks ago but has not heard back from the thoracic surgeon.  He called them today and they stated the doctor has not looked at the scan yet.  I advised Daniel Adkins to call them again next week.  He is contemplating doing surgery elsewhere if they don't get back to him.

## 2024-02-02 NOTE — Progress Notes (Signed)
 Office Visit  Subjective   Patient ID: Daniel Adkins   DOB: Feb 07, 1946   Age: 78 y.o.   MRN: [id number]   Chief Complaint No chief complaint on file.    History of Present Illness He went to go see cardiology on 10/23/2023 where he has a history of single-vessel coronary artery disease with severe stenosis of the LAD seen on heart catheterization June 2017. Patient had drug-eluting stent placed to LAD at that time.  History of moderate degree of aortic stenosis seen on echocardiogram May 2022 with a calculated aortic valve area of 1.1 sq cm. Patient was evaluated by [facility] January 2024 and was reported to have a moderate degree of aortic stenosis secondary to bicuspid aortic valve.  Bicuspid aortic valve with aortic stenosis.  Echocardiogram 10/23/2023 here in their office revealing a moderate to severe degree of aortic stenosis. Peak aortic valve gradient 40 mm of mercury, mean gradient 22 mm of mercury and a calculated aortic valve area 1 sq cm. DVI 0.3. Normal left ventricular size with mild concentric LVH normal systolic function with EF 60% and by biplane 63%.  He has a normal IV regadenoson  nuclear stress test 10/23/2023 here in our office. This revealed normal perfusion. There was no evidence for ischemia previous infarction. Normal gated SPECT EF 56%. They felt he had moderate to severe aortic stenosis. He does have some mild symptoms of dyspnea on exertion particularly when walking uphill or carrying objects or loads in his arms. He also indicates he does not have the stamina that he used to.  They felt he could benefit from a TAVR or aortic valve replacement,  The patient tells me he had a heart cath on 01/04/2024 and they noted severe coronary atherosclerosis.  They recommended a coronary bypass and AV replacement.  CT chest on 01/03/2024 was done for preCABG workup showed a calcified granulomain the right lower lobe consistent with prior granulomatous disease. No  bronchiectasis or septal thickening is seen.  There was no acute findings. There was coronary atherosclerosis and bulky aortic valve leaflet calcifications. No calcification involving the ascending thoracic aorta.  He had a coronary CT angiogram on 01/17/2024.  They did a coronary CT on 01/04/2024 to reevaluate the aortic valve.  They noted a nodular contour of the liver with hypertrophy of the caudate and left lobe is noted suggestive of cirrhosis. No thoracic lymphadenopathy.  Otherwise, no significant noncardiac findings.  A CT of the heart was done on 01/20/2024 which showed a aortic valve calcium score 3604. 2. Severe aortic stenosis by cardiac CT criteria with planimetry aortic valve area 0.7 sq cm. 3. Severe coronary artery calcification. 4. Normal left ventricular systolic function. Borderline concentric left ventricular hypertrophy. No evidence of left ventricular thrombus. 5. Mild annular calcification. Mild LVOT calcification. 6. Intact interatrial septum 7. Normal size left atrium. 8. No pericardial effusion. 9. Cardiac measurements for transcatheter aortic valve replacement planning as below. 10. Noncardiac part, and CTA of the chest, abdomen, and pelvis with contrast are reported separately. Indication: Pre-procedural planning for transcatheter aortic valve replacement. Nonrheumatic aortic stenosis.   Normal left ventricular systolic function with calculated LVEF 62%. No regional wall motion abnormality. No left ventricular thrombus.     Past Medical History Past Medical History:  Diagnosis Date   Arthritis 07/04/2017   CAD (coronary artery disease)    cath 2002- med treatment   Chronic maxillary sinusitis 04/23/2019   Coronary artery disease involving native coronary artery of native heart with  angina pectoris (HCC) 10/18/2012   Coronary artery disease    Difficult intubation    I have a short neck (10/30/2012)   Gout    Hyperlipidemia    Hypertension    Hypertensive disorder 07/04/2017    hypertension   Obstructive sleep apnea 09/18/2011   Overview:  CPAP   OSA on CPAP    PAD (peripheral artery disease) (HCC)    Peripheral arterial occlusive disease (HCC) 10/18/2012   Overview:  Overview:  Peripheral Vascular disease  Last Assessment & Plan:  History of peripheral vascular disease status post diamondback orbital rotational atherectomy, PTCA and stenting of a highly calcified distal left SFA 10/30/12. He did have 2 vessel runoff bilaterally with moderate right SFA disease as well. He was complaining of some atypical left thigh pain. Lower extremity Dopplers perfo   Peripheral vascular disease with claudication (HCC) 10/18/2012   Peripheral Vascular disease    Right ear impacted cerumen 10/16/2018     Allergies Allergies  Allergen Reactions   Cephalosporins Anaphylaxis and Hives    Other reaction(s): Hives   Aspirin      Other reaction(s): Other (See Comments) Excessive bleeding and bruising   Rosuvastatin     Other reaction(s): Other (See Comments) Muscle aches Muscle aches    Cephalexin     Other reaction(s): Not available   Sulfa Antibiotics     Other reaction(s): Unknown     Medications  Current Outpatient Medications:    aspirin  EC 81 MG tablet, Take 1 tablet (81 mg total) by mouth daily., Disp: 30 tablet, Rfl: 2   clopidogrel  (PLAVIX ) 75 MG tablet, Take 1 tablet (75 mg total) by mouth daily with breakfast., Disp: 90 tablet, Rfl: 3   Evolocumab  (REPATHA  SURECLICK) 140 MG/ML SOAJ, Inject 140 mg into the skin every 14 (fourteen) days., Disp: 6 mL, Rfl: 0   folic acid  (FOLVITE ) 1 MG tablet, Take 1 tablet (1 mg total) by mouth daily., Disp: 90 tablet, Rfl: 3   lisinopril  (ZESTRIL ) 5 MG tablet, Take 1 tablet (5 mg total) by mouth daily. Take 0.5 tablet ( 10 mg ) tablet daily, Disp: 90 tablet, Rfl: 3   MAGNESIUM PO, Take 1,000 mg by mouth daily at 12 noon., Disp: , Rfl:    Omega-3 Fatty Acids (FISH OIL ) 1200 MG CAPS, Take 1 capsule (1,200 mg total) by mouth daily., Disp: 30  capsule, Rfl: 2   omeprazole  (PRILOSEC) 40 MG capsule, Take 1 capsule (40 mg total) by mouth daily., Disp: 90 capsule, Rfl: 3   tadalafil  (CIALIS ) 20 MG tablet, Take 1 tablet (20 mg total) by mouth daily as needed for erectile dysfunction., Disp: 100 tablet, Rfl: 0   Review of Systems Review of Systems  Respiratory:  Negative for cough and shortness of breath.   Cardiovascular:  Negative for chest pain, palpitations and leg swelling.  Gastrointestinal:  Negative for abdominal pain, constipation, diarrhea, nausea and vomiting.  Neurological:  Negative for dizziness, weakness and headaches.       Objective:    Vitals There were no vitals taken for this visit.   Physical Examination Physical Exam Constitutional:      Appearance: Normal appearance.  Neurological:     Mental Status: He is alert.  Psychiatric:        Mood and Affect: Mood normal.        Assessment & Plan:   Coronary artery disease involving native coronary artery of native heart with angina pectoris [facility]) The patient states they cancelled his surgery  and wanted to do a MRI to relook at his aortic valve.  He had that done weeks ago but has not heard back from the thoracic surgeon.  He called them today and they stated the doctor has not looked at the scan yet.  I advised [name] to call them again next week.  He is contemplating doing surgery elsewhere if they don't get back to him.    No follow-ups on file.   Selinda Fleeta Finger, MD

## 2024-02-05 ENCOUNTER — Telehealth: Admitting: Internal Medicine

## 2024-02-05 DIAGNOSIS — G4733 Obstructive sleep apnea (adult) (pediatric): Secondary | ICD-10-CM

## 2024-02-05 DIAGNOSIS — I25119 Atherosclerotic heart disease of native coronary artery with unspecified angina pectoris: Secondary | ICD-10-CM

## 2024-02-05 NOTE — Assessment & Plan Note (Signed)
 He is not having any complaints today.  He is interested in a second opinion at [facility] cardiology in [place].  He had his cardiac MRI sent to us .

## 2024-02-05 NOTE — Progress Notes (Addendum)
 Office Visit  Subjective   Patient ID: Daniel Adkins   DOB: 08-Jan-1946   Age: 78 y.o.   MRN: [id number]   Chief Complaint No chief complaint on file.    History of Present Illness Daniel Adkins calls in for telehealth as the cardiothoracic surgeon read his cardiac MRI.  Again, he went to go see cardiology on 10/23/2023 where he has a history of single-vessel coronary artery disease with severe stenosis of the LAD seen on heart catheterization June 2017. Patient had drug-eluting stent placed to LAD at that time.  History of moderate degree of aortic stenosis seen on echocardiogram May 2022 with a calculated aortic valve area of 1.1 sq cm. Patient was evaluated by [facility] January 2024 and was reported to have a moderate degree of aortic stenosis secondary to bicuspid aortic valve.  Bicuspid aortic valve with aortic stenosis.  Echocardiogram 10/23/2023 here in their office revealing a moderate to severe degree of aortic stenosis. Peak aortic valve gradient 40 mm of mercury, mean gradient 22 mm of mercury and a calculated aortic valve area 1 sq cm. DVI 0.3. Normal left ventricular size with mild concentric LVH normal systolic function with EF 60% and by biplane 63%.  He has a normal IV regadenoson  nuclear stress test 10/23/2023 here in our office. This revealed normal perfusion. There was no evidence for ischemia previous infarction. Normal gated SPECT EF 56%. They felt he had moderate to severe aortic stenosis. He does have some mild symptoms of dyspnea on exertion particularly when walking uphill or carrying objects or loads in his arms. He also indicates he does not have the stamina that he used to.  They felt he could benefit from a TAVR or aortic valve replacement,  The patient tells me he had a heart cath on 01/04/2024 and they noted severe coronary atherosclerosis.  They recommended a coronary bypass and AV replacement.  CT chest on 01/03/2024 was done for preCABG workup showed a calcified  granulomain the right lower lobe consistent with prior granulomatous disease. No bronchiectasis or septal thickening is seen.  There was no acute findings. There was coronary atherosclerosis and bulky aortic valve leaflet calcifications. No calcification involving the ascending thoracic aorta.  He had a coronary CT angiogram on 01/17/2024.  They did a coronary CT on 01/04/2024 to reevaluate the aortic valve.  They noted a nodular contour of the liver with hypertrophy of the caudate and left lobe is noted suggestive of cirrhosis. No thoracic lymphadenopathy.  Otherwise, no significant noncardiac findings.  A CT of the heart was done on 01/20/2024 which showed a aortic valve calcium score 3604. 2. Severe aortic stenosis by cardiac CT criteria with planimetry aortic valve area 0.7 sq cm. 3. Severe coronary artery calcification. 4. Normal left ventricular systolic function. Borderline concentric left ventricular hypertrophy. No evidence of left ventricular thrombus. 5. Mild annular calcification. Mild LVOT calcification. 6. Intact interatrial septum 7. Normal size left atrium. 8. No pericardial effusion. 9. Cardiac measurements for transcatheter aortic valve replacement planning as below. 10. Noncardiac part, and CTA of the chest, abdomen, and pelvis with contrast are reported separately. Indication: Pre-procedural planning for transcatheter aortic valve replacement. Nonrheumatic aortic stenosis.   Normal left ventricular systolic function with calculated LVEF 62%. No regional wall motion abnormality. No left ventricular thrombus.  The patient also has a history of OSA where he is compliant on his home CPAP.     Past Medical History Past Medical History:  Diagnosis Date   Arthritis 07/04/2017  CAD (coronary artery disease)    cath 2002- med treatment   Chronic maxillary sinusitis 04/23/2019   Coronary artery disease involving native coronary artery of native heart with angina pectoris 10/18/2012   Coronary artery  disease    Difficult intubation    I have a short neck (10/30/2012)   Gout    Hyperlipidemia    Hypertension    Hypertensive disorder 07/04/2017   hypertension   Obstructive sleep apnea 09/18/2011   Overview:  CPAP   OSA on CPAP    PAD (peripheral artery disease)    Peripheral arterial occlusive disease 10/18/2012   Overview:  Overview:  Peripheral Vascular disease  Last Assessment & Plan:  History of peripheral vascular disease status post diamondback orbital rotational atherectomy, PTCA and stenting of a highly calcified distal left SFA 10/30/12. He did have 2 vessel runoff bilaterally with moderate right SFA disease as well. He was complaining of some atypical left thigh pain. Lower extremity Dopplers perfo   Peripheral vascular disease with claudication 10/18/2012   Peripheral Vascular disease    Right ear impacted cerumen 10/16/2018     Allergies Allergies  Allergen Reactions   Cephalosporins Anaphylaxis and Hives    Other reaction(s): Hives   Aspirin      Other reaction(s): Other (See Comments) Excessive bleeding and bruising   Rosuvastatin     Other reaction(s): Other (See Comments) Muscle aches Muscle aches    Cephalexin     Other reaction(s): Not available   Sulfa Antibiotics     Other reaction(s): Unknown     Medications  Current Outpatient Medications:    aspirin  EC 81 MG tablet, Take 1 tablet (81 mg total) by mouth daily., Disp: 30 tablet, Rfl: 2   clopidogrel  (PLAVIX ) 75 MG tablet, Take 1 tablet (75 mg total) by mouth daily with breakfast., Disp: 90 tablet, Rfl: 3   Evolocumab  (REPATHA  SURECLICK) 140 MG/ML SOAJ, Inject 140 mg into the skin every 14 (fourteen) days., Disp: 6 mL, Rfl: 0   folic acid  (FOLVITE ) 1 MG tablet, Take 1 tablet (1 mg total) by mouth daily., Disp: 90 tablet, Rfl: 3   lisinopril  (ZESTRIL ) 5 MG tablet, Take 1 tablet (5 mg total) by mouth daily. Take 0.5 tablet ( 10 mg ) tablet daily, Disp: 90 tablet, Rfl: 3   MAGNESIUM PO, Take 1,000 mg by mouth  daily at 12 noon., Disp: , Rfl:    Omega-3 Fatty Acids (FISH OIL ) 1200 MG CAPS, Take 1 capsule (1,200 mg total) by mouth daily., Disp: 30 capsule, Rfl: 2   omeprazole  (PRILOSEC) 40 MG capsule, Take 1 capsule (40 mg total) by mouth daily., Disp: 90 capsule, Rfl: 3   tadalafil  (CIALIS ) 20 MG tablet, Take 1 tablet (20 mg total) by mouth daily as needed for erectile dysfunction., Disp: 100 tablet, Rfl: 0   Review of Systems Review of Systems  Constitutional:  Negative for chills, fever, malaise/fatigue and weight loss.  Respiratory:  Negative for cough and shortness of breath.   Cardiovascular:  Negative for chest pain, palpitations and leg swelling.  Gastrointestinal:  Negative for abdominal pain, constipation, diarrhea, heartburn, nausea and vomiting.  Musculoskeletal:  Negative for myalgias.  Skin:  Negative for itching and rash.  Neurological:  Negative for dizziness, weakness and headaches.       Objective:    Vitals There were no vitals taken for this visit.   Physical Examination Physical Exam Constitutional:      Appearance: Normal appearance.  Neurological:  Mental Status: He is alert.  Psychiatric:        Mood and Affect: Mood normal.        Assessment & Plan:   Coronary artery disease involving native coronary artery of native heart with angina pectoris He is not having any complaints today.  He is interested in a second opinion at [facility] cardiology in [place].  He had his cardiac MRI sent to us .  OSA (obstructive sleep apnea) The patient is currently compliant with his CPAP.    No follow-ups on file.   Selinda Fleeta Finger, MD

## 2024-02-10 DIAGNOSIS — G4733 Obstructive sleep apnea (adult) (pediatric): Secondary | ICD-10-CM | POA: Diagnosis not present

## 2024-02-13 NOTE — Assessment & Plan Note (Signed)
 The patient is currently compliant with his CPAP.

## 2024-02-14 ENCOUNTER — Ambulatory Visit

## 2024-02-14 DIAGNOSIS — I739 Peripheral vascular disease, unspecified: Secondary | ICD-10-CM | POA: Diagnosis not present

## 2024-02-14 DIAGNOSIS — E7849 Other hyperlipidemia: Secondary | ICD-10-CM | POA: Diagnosis not present

## 2024-02-14 DIAGNOSIS — Z7982 Long term (current) use of aspirin: Secondary | ICD-10-CM | POA: Diagnosis not present

## 2024-02-14 DIAGNOSIS — I10 Essential (primary) hypertension: Secondary | ICD-10-CM | POA: Diagnosis not present

## 2024-02-14 DIAGNOSIS — I2510 Atherosclerotic heart disease of native coronary artery without angina pectoris: Secondary | ICD-10-CM | POA: Diagnosis not present

## 2024-02-14 DIAGNOSIS — Z7902 Long term (current) use of antithrombotics/antiplatelets: Secondary | ICD-10-CM | POA: Diagnosis not present

## 2024-02-14 DIAGNOSIS — Z95828 Presence of other vascular implants and grafts: Secondary | ICD-10-CM | POA: Diagnosis not present

## 2024-02-14 DIAGNOSIS — Q2381 Bicuspid aortic valve: Secondary | ICD-10-CM | POA: Diagnosis not present

## 2024-02-14 DIAGNOSIS — E785 Hyperlipidemia, unspecified: Secondary | ICD-10-CM | POA: Diagnosis not present

## 2024-02-14 DIAGNOSIS — Z952 Presence of prosthetic heart valve: Secondary | ICD-10-CM | POA: Diagnosis not present

## 2024-02-14 DIAGNOSIS — I350 Nonrheumatic aortic (valve) stenosis: Secondary | ICD-10-CM | POA: Diagnosis not present

## 2024-02-15 DIAGNOSIS — Z8249 Family history of ischemic heart disease and other diseases of the circulatory system: Secondary | ICD-10-CM | POA: Diagnosis not present

## 2024-02-15 DIAGNOSIS — Z7982 Long term (current) use of aspirin: Secondary | ICD-10-CM | POA: Diagnosis not present

## 2024-02-15 DIAGNOSIS — I10 Essential (primary) hypertension: Secondary | ICD-10-CM | POA: Diagnosis not present

## 2024-02-15 DIAGNOSIS — E785 Hyperlipidemia, unspecified: Secondary | ICD-10-CM | POA: Diagnosis not present

## 2024-02-15 DIAGNOSIS — E669 Obesity, unspecified: Secondary | ICD-10-CM | POA: Diagnosis not present

## 2024-02-15 DIAGNOSIS — G4733 Obstructive sleep apnea (adult) (pediatric): Secondary | ICD-10-CM | POA: Diagnosis not present

## 2024-02-15 DIAGNOSIS — M1990 Unspecified osteoarthritis, unspecified site: Secondary | ICD-10-CM | POA: Diagnosis not present

## 2024-02-15 DIAGNOSIS — K219 Gastro-esophageal reflux disease without esophagitis: Secondary | ICD-10-CM | POA: Diagnosis not present

## 2024-02-15 DIAGNOSIS — R011 Cardiac murmur, unspecified: Secondary | ICD-10-CM | POA: Diagnosis not present

## 2024-02-21 DIAGNOSIS — I350 Nonrheumatic aortic (valve) stenosis: Secondary | ICD-10-CM | POA: Diagnosis not present

## 2024-02-21 DIAGNOSIS — R0609 Other forms of dyspnea: Secondary | ICD-10-CM | POA: Diagnosis not present

## 2024-02-23 DIAGNOSIS — R0609 Other forms of dyspnea: Secondary | ICD-10-CM | POA: Diagnosis not present

## 2024-02-23 DIAGNOSIS — I350 Nonrheumatic aortic (valve) stenosis: Secondary | ICD-10-CM | POA: Diagnosis not present

## 2024-02-23 DIAGNOSIS — R918 Other nonspecific abnormal finding of lung field: Secondary | ICD-10-CM | POA: Diagnosis not present

## 2024-02-23 DIAGNOSIS — Q231 Congenital insufficiency of aortic valve: Secondary | ICD-10-CM | POA: Diagnosis not present

## 2024-02-23 DIAGNOSIS — Q2381 Bicuspid aortic valve: Secondary | ICD-10-CM | POA: Diagnosis not present

## 2024-02-23 DIAGNOSIS — K5730 Diverticulosis of large intestine without perforation or abscess without bleeding: Secondary | ICD-10-CM | POA: Diagnosis not present

## 2024-02-23 DIAGNOSIS — K760 Fatty (change of) liver, not elsewhere classified: Secondary | ICD-10-CM | POA: Diagnosis not present

## 2024-02-23 DIAGNOSIS — Z01810 Encounter for preprocedural cardiovascular examination: Secondary | ICD-10-CM | POA: Diagnosis not present

## 2024-02-29 DIAGNOSIS — I110 Hypertensive heart disease with heart failure: Secondary | ICD-10-CM | POA: Diagnosis not present

## 2024-02-29 DIAGNOSIS — R0609 Other forms of dyspnea: Secondary | ICD-10-CM | POA: Diagnosis not present

## 2024-02-29 DIAGNOSIS — Z951 Presence of aortocoronary bypass graft: Secondary | ICD-10-CM | POA: Diagnosis not present

## 2024-02-29 DIAGNOSIS — I5030 Unspecified diastolic (congestive) heart failure: Secondary | ICD-10-CM | POA: Diagnosis not present

## 2024-02-29 DIAGNOSIS — Z7902 Long term (current) use of antithrombotics/antiplatelets: Secondary | ICD-10-CM | POA: Diagnosis not present

## 2024-02-29 DIAGNOSIS — I25119 Atherosclerotic heart disease of native coronary artery with unspecified angina pectoris: Secondary | ICD-10-CM | POA: Diagnosis not present

## 2024-02-29 DIAGNOSIS — I350 Nonrheumatic aortic (valve) stenosis: Secondary | ICD-10-CM | POA: Diagnosis not present

## 2024-02-29 DIAGNOSIS — I739 Peripheral vascular disease, unspecified: Secondary | ICD-10-CM | POA: Diagnosis not present

## 2024-02-29 DIAGNOSIS — I5032 Chronic diastolic (congestive) heart failure: Secondary | ICD-10-CM | POA: Diagnosis not present

## 2024-02-29 DIAGNOSIS — Z95828 Presence of other vascular implants and grafts: Secondary | ICD-10-CM | POA: Diagnosis not present

## 2024-02-29 DIAGNOSIS — E669 Obesity, unspecified: Secondary | ICD-10-CM | POA: Diagnosis not present

## 2024-02-29 DIAGNOSIS — I2510 Atherosclerotic heart disease of native coronary artery without angina pectoris: Secondary | ICD-10-CM | POA: Diagnosis not present

## 2024-03-05 DIAGNOSIS — I5032 Chronic diastolic (congestive) heart failure: Secondary | ICD-10-CM | POA: Diagnosis not present

## 2024-03-05 DIAGNOSIS — I350 Nonrheumatic aortic (valve) stenosis: Secondary | ICD-10-CM | POA: Diagnosis not present

## 2024-03-05 DIAGNOSIS — I110 Hypertensive heart disease with heart failure: Secondary | ICD-10-CM | POA: Diagnosis not present

## 2024-03-05 DIAGNOSIS — Z79899 Other long term (current) drug therapy: Secondary | ICD-10-CM | POA: Diagnosis not present

## 2024-03-05 DIAGNOSIS — I25118 Atherosclerotic heart disease of native coronary artery with other forms of angina pectoris: Secondary | ICD-10-CM | POA: Diagnosis not present

## 2024-03-14 DIAGNOSIS — I5032 Chronic diastolic (congestive) heart failure: Secondary | ICD-10-CM | POA: Diagnosis not present

## 2024-03-14 DIAGNOSIS — E1165 Type 2 diabetes mellitus with hyperglycemia: Secondary | ICD-10-CM | POA: Diagnosis not present

## 2024-03-14 DIAGNOSIS — D689 Coagulation defect, unspecified: Secondary | ICD-10-CM | POA: Diagnosis not present

## 2024-03-14 DIAGNOSIS — Z7901 Long term (current) use of anticoagulants: Secondary | ICD-10-CM | POA: Diagnosis not present

## 2024-03-14 DIAGNOSIS — D696 Thrombocytopenia, unspecified: Secondary | ICD-10-CM | POA: Diagnosis not present

## 2024-03-14 DIAGNOSIS — E66811 Obesity, class 1: Secondary | ICD-10-CM | POA: Diagnosis not present

## 2024-03-14 DIAGNOSIS — D62 Acute posthemorrhagic anemia: Secondary | ICD-10-CM | POA: Diagnosis not present

## 2024-03-14 DIAGNOSIS — I350 Nonrheumatic aortic (valve) stenosis: Secondary | ICD-10-CM | POA: Diagnosis not present

## 2024-03-14 DIAGNOSIS — I110 Hypertensive heart disease with heart failure: Secondary | ICD-10-CM | POA: Diagnosis not present

## 2024-03-18 DIAGNOSIS — E872 Acidosis, unspecified: Secondary | ICD-10-CM | POA: Diagnosis not present

## 2024-03-18 DIAGNOSIS — D689 Coagulation defect, unspecified: Secondary | ICD-10-CM | POA: Diagnosis not present

## 2024-03-18 DIAGNOSIS — I4589 Other specified conduction disorders: Secondary | ICD-10-CM | POA: Diagnosis not present

## 2024-03-18 DIAGNOSIS — Z952 Presence of prosthetic heart valve: Secondary | ICD-10-CM | POA: Diagnosis not present

## 2024-03-18 DIAGNOSIS — J90 Pleural effusion, not elsewhere classified: Secondary | ICD-10-CM | POA: Diagnosis not present

## 2024-03-18 DIAGNOSIS — E66811 Obesity, class 1: Secondary | ICD-10-CM | POA: Diagnosis not present

## 2024-03-18 DIAGNOSIS — Z978 Presence of other specified devices: Secondary | ICD-10-CM | POA: Diagnosis not present

## 2024-03-18 DIAGNOSIS — I358 Other nonrheumatic aortic valve disorders: Secondary | ICD-10-CM | POA: Diagnosis not present

## 2024-03-18 DIAGNOSIS — D696 Thrombocytopenia, unspecified: Secondary | ICD-10-CM | POA: Diagnosis not present

## 2024-03-18 DIAGNOSIS — Z951 Presence of aortocoronary bypass graft: Secondary | ICD-10-CM | POA: Diagnosis not present

## 2024-03-18 DIAGNOSIS — D62 Acute posthemorrhagic anemia: Secondary | ICD-10-CM | POA: Diagnosis not present

## 2024-03-18 DIAGNOSIS — Z4682 Encounter for fitting and adjustment of non-vascular catheter: Secondary | ICD-10-CM | POA: Diagnosis not present

## 2024-03-18 DIAGNOSIS — I110 Hypertensive heart disease with heart failure: Secondary | ICD-10-CM | POA: Diagnosis not present

## 2024-03-18 DIAGNOSIS — Z9889 Other specified postprocedural states: Secondary | ICD-10-CM | POA: Diagnosis not present

## 2024-03-18 DIAGNOSIS — J811 Chronic pulmonary edema: Secondary | ICD-10-CM | POA: Diagnosis not present

## 2024-03-18 DIAGNOSIS — I25118 Atherosclerotic heart disease of native coronary artery with other forms of angina pectoris: Secondary | ICD-10-CM | POA: Diagnosis not present

## 2024-03-18 DIAGNOSIS — J9811 Atelectasis: Secondary | ICD-10-CM | POA: Diagnosis not present

## 2024-03-18 DIAGNOSIS — R9431 Abnormal electrocardiogram [ECG] [EKG]: Secondary | ICD-10-CM | POA: Diagnosis not present

## 2024-03-18 DIAGNOSIS — R0989 Other specified symptoms and signs involving the circulatory and respiratory systems: Secondary | ICD-10-CM | POA: Diagnosis not present

## 2024-03-18 DIAGNOSIS — I5031 Acute diastolic (congestive) heart failure: Secondary | ICD-10-CM | POA: Diagnosis not present

## 2024-03-18 DIAGNOSIS — I739 Peripheral vascular disease, unspecified: Secondary | ICD-10-CM | POA: Diagnosis not present

## 2024-03-18 DIAGNOSIS — R918 Other nonspecific abnormal finding of lung field: Secondary | ICD-10-CM | POA: Diagnosis not present

## 2024-03-18 DIAGNOSIS — I2510 Atherosclerotic heart disease of native coronary artery without angina pectoris: Secondary | ICD-10-CM | POA: Diagnosis not present

## 2024-03-18 DIAGNOSIS — I5032 Chronic diastolic (congestive) heart failure: Secondary | ICD-10-CM | POA: Diagnosis not present

## 2024-03-18 DIAGNOSIS — I350 Nonrheumatic aortic (valve) stenosis: Secondary | ICD-10-CM | POA: Diagnosis not present

## 2024-03-18 DIAGNOSIS — Z7901 Long term (current) use of anticoagulants: Secondary | ICD-10-CM | POA: Diagnosis not present

## 2024-03-18 DIAGNOSIS — I440 Atrioventricular block, first degree: Secondary | ICD-10-CM | POA: Diagnosis not present

## 2024-03-18 DIAGNOSIS — E1165 Type 2 diabetes mellitus with hyperglycemia: Secondary | ICD-10-CM | POA: Diagnosis not present

## 2024-03-18 DIAGNOSIS — R739 Hyperglycemia, unspecified: Secondary | ICD-10-CM | POA: Diagnosis not present

## 2024-03-18 DIAGNOSIS — G4733 Obstructive sleep apnea (adult) (pediatric): Secondary | ICD-10-CM | POA: Diagnosis not present

## 2024-03-18 DIAGNOSIS — Z794 Long term (current) use of insulin: Secondary | ICD-10-CM | POA: Diagnosis not present

## 2024-03-18 DIAGNOSIS — Z0489 Encounter for examination and observation for other specified reasons: Secondary | ICD-10-CM | POA: Diagnosis not present

## 2024-03-21 ENCOUNTER — Other Ambulatory Visit: Payer: Self-pay | Admitting: Internal Medicine

## 2024-04-01 DIAGNOSIS — J90 Pleural effusion, not elsewhere classified: Secondary | ICD-10-CM | POA: Diagnosis not present

## 2024-04-01 DIAGNOSIS — I350 Nonrheumatic aortic (valve) stenosis: Secondary | ICD-10-CM | POA: Diagnosis not present

## 2024-04-11 DIAGNOSIS — G4733 Obstructive sleep apnea (adult) (pediatric): Secondary | ICD-10-CM | POA: Diagnosis not present

## 2024-04-12 DIAGNOSIS — R079 Chest pain, unspecified: Secondary | ICD-10-CM | POA: Diagnosis not present

## 2024-04-12 DIAGNOSIS — T82857A Stenosis of cardiac prosthetic devices, implants and grafts, initial encounter: Secondary | ICD-10-CM | POA: Diagnosis not present

## 2024-04-12 DIAGNOSIS — J90 Pleural effusion, not elsewhere classified: Secondary | ICD-10-CM | POA: Diagnosis not present

## 2024-04-12 DIAGNOSIS — R918 Other nonspecific abnormal finding of lung field: Secondary | ICD-10-CM | POA: Diagnosis not present

## 2024-04-14 DIAGNOSIS — G4733 Obstructive sleep apnea (adult) (pediatric): Secondary | ICD-10-CM | POA: Diagnosis not present

## 2024-04-18 DIAGNOSIS — Z952 Presence of prosthetic heart valve: Secondary | ICD-10-CM | POA: Diagnosis not present

## 2024-04-18 DIAGNOSIS — J90 Pleural effusion, not elsewhere classified: Secondary | ICD-10-CM | POA: Diagnosis not present

## 2024-06-14 ENCOUNTER — Encounter: Payer: Medicare PPO | Admitting: Internal Medicine
